# Patient Record
Sex: Female | Born: 1960 | Race: White | Hispanic: No | Marital: Married | Smoking: Never smoker
Health system: Southern US, Community
[De-identification: ages and names within clinical notes are randomized; demographics above are authoritative.]

## PROBLEM LIST (undated history)

## (undated) DIAGNOSIS — F419 Anxiety disorder, unspecified: Secondary | ICD-10-CM

## (undated) DIAGNOSIS — K759 Inflammatory liver disease, unspecified: Secondary | ICD-10-CM

## (undated) DIAGNOSIS — B019 Varicella without complication: Secondary | ICD-10-CM

## (undated) DIAGNOSIS — M19019 Primary osteoarthritis, unspecified shoulder: Secondary | ICD-10-CM

## (undated) DIAGNOSIS — F329 Major depressive disorder, single episode, unspecified: Secondary | ICD-10-CM

## (undated) DIAGNOSIS — D229 Melanocytic nevi, unspecified: Secondary | ICD-10-CM

## (undated) DIAGNOSIS — Z86010 Personal history of colonic polyps: Principal | ICD-10-CM

## (undated) DIAGNOSIS — R51 Headache: Secondary | ICD-10-CM

## (undated) DIAGNOSIS — F32A Depression, unspecified: Secondary | ICD-10-CM

## (undated) DIAGNOSIS — F509 Eating disorder, unspecified: Secondary | ICD-10-CM

## (undated) DIAGNOSIS — F1010 Alcohol abuse, uncomplicated: Secondary | ICD-10-CM

## (undated) DIAGNOSIS — K219 Gastro-esophageal reflux disease without esophagitis: Secondary | ICD-10-CM

## (undated) DIAGNOSIS — R17 Unspecified jaundice: Secondary | ICD-10-CM

## (undated) DIAGNOSIS — N200 Calculus of kidney: Secondary | ICD-10-CM

## (undated) DIAGNOSIS — IMO0001 Reserved for inherently not codable concepts without codable children: Secondary | ICD-10-CM

## (undated) DIAGNOSIS — Z8742 Personal history of other diseases of the female genital tract: Secondary | ICD-10-CM

## (undated) DIAGNOSIS — I10 Essential (primary) hypertension: Secondary | ICD-10-CM

## (undated) DIAGNOSIS — R32 Unspecified urinary incontinence: Secondary | ICD-10-CM

## (undated) DIAGNOSIS — M1990 Unspecified osteoarthritis, unspecified site: Secondary | ICD-10-CM

## (undated) HISTORY — DX: Personal history of colonic polyps: Z86.010

## (undated) HISTORY — DX: Alcohol abuse, uncomplicated: F10.10

## (undated) HISTORY — DX: Gastro-esophageal reflux disease without esophagitis: K21.9

## (undated) HISTORY — DX: Inflammatory liver disease, unspecified: K75.9

## (undated) HISTORY — DX: Major depressive disorder, single episode, unspecified: F32.9

## (undated) HISTORY — DX: Essential (primary) hypertension: I10

## (undated) HISTORY — DX: Calculus of kidney: N20.0

## (undated) HISTORY — DX: Headache: R51

## (undated) HISTORY — DX: Unspecified osteoarthritis, unspecified site: M19.90

## (undated) HISTORY — DX: Varicella without complication: B01.9

## (undated) HISTORY — DX: Reserved for inherently not codable concepts without codable children: IMO0001

## (undated) HISTORY — DX: Personal history of other diseases of the female genital tract: Z87.42

## (undated) HISTORY — PX: OTHER SURGICAL HISTORY: SHX169

## (undated) HISTORY — DX: Depression, unspecified: F32.A

## (undated) HISTORY — DX: Unspecified urinary incontinence: R32

## (undated) HISTORY — DX: Eating disorder, unspecified: F50.9

## (undated) HISTORY — DX: Unspecified jaundice: R17

## (undated) HISTORY — DX: Primary osteoarthritis, unspecified shoulder: M19.019

## (undated) HISTORY — DX: Melanocytic nevi, unspecified: D22.9

---

## 2007-10-19 ENCOUNTER — Emergency Department (HOSPITAL_COMMUNITY): Admission: EM | Admit: 2007-10-19 | Discharge: 2007-10-19 | Payer: Self-pay | Admitting: Emergency Medicine

## 2009-03-29 ENCOUNTER — Emergency Department (HOSPITAL_COMMUNITY): Admission: EM | Admit: 2009-03-29 | Discharge: 2009-03-29 | Payer: Self-pay | Admitting: Emergency Medicine

## 2010-03-31 ENCOUNTER — Other Ambulatory Visit: Admission: RE | Admit: 2010-03-31 | Discharge: 2010-03-31 | Payer: Self-pay | Admitting: Family Medicine

## 2010-04-29 ENCOUNTER — Encounter: Admission: RE | Admit: 2010-04-29 | Discharge: 2010-04-29 | Payer: Self-pay | Admitting: Family Medicine

## 2010-07-11 ENCOUNTER — Encounter
Admission: RE | Admit: 2010-07-11 | Discharge: 2010-07-11 | Payer: Self-pay | Source: Home / Self Care | Attending: Orthopedic Surgery | Admitting: Orthopedic Surgery

## 2011-03-28 LAB — CULTURE, ROUTINE-ABSCESS

## 2011-12-25 ENCOUNTER — Other Ambulatory Visit (HOSPITAL_COMMUNITY)
Admission: RE | Admit: 2011-12-25 | Discharge: 2011-12-25 | Disposition: A | Discharge status: Home / Self Care | Payer: BC Managed Care – PPO | Source: Ambulatory Visit | Attending: Family Medicine | Admitting: Family Medicine

## 2011-12-25 DIAGNOSIS — Z124 Encounter for screening for malignant neoplasm of cervix: Secondary | ICD-10-CM | POA: Insufficient documentation

## 2012-04-02 ENCOUNTER — Encounter (HOSPITAL_COMMUNITY): Payer: Self-pay | Admitting: *Deleted

## 2012-04-02 ENCOUNTER — Ambulatory Visit (HOSPITAL_COMMUNITY)
Admission: RE | Admit: 2012-04-02 | Discharge: 2012-04-02 | Disposition: A | Discharge status: Home / Self Care | Payer: BC Managed Care – PPO | Attending: Psychiatry | Admitting: Psychiatry

## 2012-04-02 DIAGNOSIS — F1020 Alcohol dependence, uncomplicated: Secondary | ICD-10-CM | POA: Insufficient documentation

## 2012-04-02 DIAGNOSIS — F4310 Post-traumatic stress disorder, unspecified: Secondary | ICD-10-CM | POA: Insufficient documentation

## 2012-04-02 HISTORY — DX: Anxiety disorder, unspecified: F41.9
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
## 2012-04-17 ENCOUNTER — Ambulatory Visit (INDEPENDENT_AMBULATORY_CARE_PROVIDER_SITE_OTHER): Payer: BC Managed Care – PPO | Admitting: Family Medicine

## 2012-04-17 ENCOUNTER — Ambulatory Visit (HOSPITAL_COMMUNITY): Admission: RE | Admit: 2012-04-17 | Payer: BC Managed Care – PPO | Source: Home / Self Care | Admitting: Psychiatry

## 2012-04-17 ENCOUNTER — Encounter: Payer: Self-pay | Admitting: Family Medicine

## 2012-04-17 VITALS — BP 124/100 | HR 82 | Temp 98.8°F | Wt 139.0 lb

## 2012-04-17 DIAGNOSIS — M25559 Pain in unspecified hip: Secondary | ICD-10-CM

## 2012-04-17 DIAGNOSIS — F1010 Alcohol abuse, uncomplicated: Secondary | ICD-10-CM

## 2012-04-17 LAB — CBC WITH DIFFERENTIAL/PLATELET
Basophils Absolute: 0 10*3/uL (ref 0.0–0.1)
Eosinophils Absolute: 0 10*3/uL (ref 0.0–0.7)
Hemoglobin: 12.8 g/dL (ref 12.0–15.0)
Lymphocytes Relative: 23.1 % (ref 12.0–46.0)
MCHC: 31.7 g/dL (ref 30.0–36.0)
Monocytes Absolute: 0.4 10*3/uL (ref 0.1–1.0)
Neutro Abs: 4.8 10*3/uL (ref 1.4–7.7)
RDW: 14.8 % — ABNORMAL HIGH (ref 11.5–14.6)

## 2012-04-17 LAB — COMPREHENSIVE METABOLIC PANEL
ALT: 26 U/L (ref 0–35)
AST: 29 U/L (ref 0–37)
Albumin: 4.1 g/dL (ref 3.5–5.2)
Calcium: 9.6 mg/dL (ref 8.4–10.5)
Chloride: 101 mEq/L (ref 96–112)
Potassium: 4 mEq/L (ref 3.5–5.1)

## 2012-04-17 LAB — URINALYSIS, ROUTINE W REFLEX MICROSCOPIC
Bilirubin Urine: NEGATIVE
Ketones, ur: NEGATIVE
Leukocytes, UA: NEGATIVE
Specific Gravity, Urine: 1.025 (ref 1.000–1.030)
Urobilinogen, UA: 0.2 (ref 0.0–1.0)

## 2012-04-17 NOTE — Progress Notes (Signed)
Chief Complaint  Patient presents with  . Establish Care    HPI:  Brittany Shepherd is here to establish care.  Per review of chart in our system, she recently enrolled in the [facility] intensive outpatient addiction chemical dependency program under the care of Dr. Allena Katz.  It also appears per review of labs that she has a history of MRSA skin abscess. In AA and in in intensive alchohol rehab program. Needs labs today for this: CMP, CBC with diff, urinalysis, UDS, Blood and alcohol level. Drinking 1/2 of fifth vodka  per day and now in intensive program monitored by MD.  Ephriam Knuckles and going to church, and feels like god is working in her life. Exercising daily, silver sneaker and gym, does yoga Wants to see Dr. Constance Goltz at [facility] orthopedics for R hip pain that started after limping around for 6 months s/p a broken toe. She also had a fall about 10 months ago and did have plain films and evaluation and thought to be a contusion, but hip pain since. Evaluation with revious PCP thought it was bursitis. Occ flares of R Lat hip pain. Worse with pigeon move in yoga and lat hip exercises with band,  Other Providers: -sees [facility], [facility] - for anxiety - took xanax in the past -had physical with PCP in June 2013, pap was normal -pt will schedule mammo -pt will schedule colonoscopy at age 38  Flu vaccine: -refused flu today -had tdap with physical  ROS: See pertinent positives and negatives per HPI.  Past Medical History  Diagnosis Date  . Anxiety   . Mental disorder   . Alcohol abuse   . Chicken pox   . Depression   . Eating disorder   . Headache     frequent  . GERD (gastroesophageal reflux disease)   . Hepatitis   . Jaundice   . Hypertension     high blood pressure readings  . Urine incontinence   . Kidney stones     Family History  Problem Relation Age of Onset  . Arthritis Other     paternal great grandfather  .  Arthritis Other     parent  . Alcohol abuse Other     paternal and maternal uncle  . Prostate cancer Paternal Grandfather   . Hyperlipidemia Mother   . Heart disease Maternal Grandmother   . Hypertension    . Mental illness    . Diabetes    . ADD / ADHD Brother   . ADD / ADHD Son     History   Social History  . Marital Status: Single    Spouse Name: N/A    Number of Children: N/A  . Years of Education: N/A   Social History Main Topics  . Smoking status: Never Smoker   . Smokeless tobacco: Never Used  . Alcohol Use: Yes     per patient vodka   . Drug Use: No  . Sexually Active: Yes   Other Topics Concern  . None   Social History Narrative  . None    Current outpatient prescriptions:acetaminophen (TYLENOL) 500 MG tablet, Take 500 mg by mouth every 6 (six) hours as needed., Disp: , Rfl: ;  FLUoxetine (PROZAC) 40 MG capsule, Take 40 mg by mouth daily., Disp: , Rfl: ;  omeprazole (PRILOSEC) 20 MG capsule, Take 20 mg by mouth daily., Disp: , Rfl: ;  saccharomyces boulardii (FLORASTOR) 250 MG capsule, Take 250 mg by mouth 2 (two)  times daily., Disp: , Rfl:  vitamin B-12 (CYANOCOBALAMIN) 1000 MCG tablet, Take 2,500 mcg by mouth daily., Disp: , Rfl: ;  Diclofenac Sodium (PENNSAID) 1.5 % SOLN, Place 20 drops onto the skin. To affected area four times a day., Disp: , Rfl:   EXAM:  Filed Vitals:   04/17/12 1140  BP: 124/100  Pulse: 82  Temp: 98.8 F (37.1 C)    There is no height on file to calculate BMI.  GENERAL: vitals reviewed and listed above, alert, oriented, appears well hydrated and in no acute distress  HEENT: atraumatic, conjunttiva clear, no obvious abnormalities on inspection of external nose and ears  NECK: no obvious masses on inspection  LUNGS: clear to auscultation bilaterally, no wheezes, rales or rhonchi, good air movement  CV: HRRR, no peripheral edema  MS: moves all extremities without noticeable abnormality TTP lateral hip over greater  trochanteric bursa region. Neg SLRT, CLRT, FABER and FADIR.  PSYCH: pleasant and cooperative, no obvious depression or anxiety  ASSESSMENT AND PLAN:  Discussed the following assessment and plan:  1. Alcohol abuse  CMP, CBC with Differential, Urinalysis with Reflex Microscopic, Drug Screen, Urine, Ethanol  2. Hip pain  Suspect greater trochanteric bursitis, will start with conservative tx per recs below. F/u in one month.   -We reviewed her PMH, PSH, FH, SH, Meds and Allergies. -We addressed her current concerns per orders and patient instructions. -We have asked for records for pertinent exams, studies, vaccines and notes from previous providers  -Patient advised to return or notify a doctor immediately if symptoms worsen or persist or new concerns arise.  Patient Instructions   -we are so proud of you with your work to get off of alcohol. Keep up the great work and let us know how we can help!  -We have ordered labs or studies at this visit. It usually takes 1-2 weeks for results and processing. We will contact you with instructions IF your results are abnormal. Normal results will be released to your [facility] in 1-2 weeks. If you have not heard from [place] or can not find your results in [facility] in 2 weeks please contact our office.  -For hip pain: -heat for 15 minutes twice daily -modified gentler pose in yoga and limiting activities that aggrevate pain for 2 weeks -passive cross legged stretch - hold 15 secs and repeat 5-10 times daily -follow up with me in 1 month  Thank you for enrolling in MyChart. Please follow the instructions below to securely access your online medical record. MyChart allows you to send messages to your doctor, view your test results, renew your prescriptions, schedule appointments, and more.  How Do I Sign Up? 1. In your Internet browser, go to [email or web address] 2. Click on the New  User? link in the Sign In box.  3. Enter your MyChart  Access Code exactly as it appears below. You will not need to use this code after you have completed the sign-up process. If you do not sign up before the expiration date, you must request a new code. MyChart Access Code: [email or web address] Expires: 05/17/2012 12:15 PM  4. Enter the last four digits of your Social Security Number (xxxx) and Date of Birth (mm/dd/yyyy) as indicated and click Next. You will be taken to the next sign-up page. 5. Create a MyChart ID. This will be your MyChart login ID and cannot be changed, so think of one that is secure and easy to remember. 6. Create a MyChart password.  You can change your password at any time. 7. Enter your Password Reset Question and Answer and click Next. This can be used at a later time if you forget your password.  8. Select your communication preference, and if applicable enter your e-mail address. You will receive e-mail notification when new information is available in MyChart by choosing to receive e-mail notifications and filling in your e-mail. 9. Click Sign In. You can now view your medical record.   Additional Information If you have questions, you can email [email or web address]  [email or web address] or call [phone] to talk to our MyChart staff. Remember, MyChart is NOT to be used for urgent needs. For medical emergencies, dial 911.          Kriste Basque R.

## 2012-04-17 NOTE — Patient Instructions (Addendum)
-  we are so proud of you with your work to get off of alcohol. Keep up the great work and let us know how we can help!  -We have ordered labs or studies at this visit. It usually takes 1-2 weeks for results and processing. We will contact you with instructions IF your results are abnormal. Normal results will be released to your [facility] in 1-2 weeks. If you have not heard from [place] or can not find your results in [facility] in 2 weeks please contact our office.  -For hip pain: -heat for 15 minutes twice daily -modified gentler pose in yoga and limiting activities that aggrevate pain for 2 weeks -passive cross legged stretch - hold 15 secs and repeat 5-10 times daily -follow up with me in 1 month  Thank you for enrolling in MyChart. Please follow the instructions below to securely access your online medical record. MyChart allows you to send messages to your doctor, view your test results, renew your prescriptions, schedule appointments, and more.  How Do I Sign Up? 1. In your Internet browser, go to [email or web address] 2. Click on the New  User? link in the Sign In box.  3. Enter your MyChart Access Code exactly as it appears below. You will not need to use this code after you have completed the sign-up process. If you do not sign up before the expiration date, you must request a new code. MyChart Access Code: [email or web address] Expires: 05/17/2012 12:15 PM  4. Enter the last four digits of your Social Security Number (xxxx) and Date of Birth (mm/dd/yyyy) as indicated and click Next. You will be taken to the next sign-up page. 5. Create a MyChart ID. This will be your MyChart login ID and cannot be changed, so think of one that is secure and easy to remember. 6. Create a MyChart password. You can change your password at any time. 7. Enter your Password Reset Question and Answer and click Next. This can be used at a later time if you forget your password.  8. Select your  communication preference, and if applicable enter your e-mail address. You will receive e-mail notification when new information is available in MyChart by choosing to receive e-mail notifications and filling in your e-mail. 9. Click Sign In. You can now view your medical record.   Additional Information If you have questions, you can email [email or web address]  [email or web address] or call [phone] to talk to our MyChart staff. Remember, MyChart is NOT to be used for urgent needs. For medical emergencies, dial 911.

## 2012-04-17 NOTE — BH Assessment (Signed)
Pt and husband arrived at [facility]. Pt requesting detox from alcohol. Pt had an assessment 04/15/2012. Pt had lab work from her PCP drawn today. Pt was requesting to be admitted to [facility] without going through the emergency department and without a second assessment. Pt is not suicidal, homicidal nor psychotic. Writer explained that: no chemical dependence beds were available today, an assessment would need to be done the day of admission, and it would be at the Dr's discretion what would be acceptable as medical clearance. Pt and husband declined assessment today. Patient has referrals she was given on Monday and will seek treatment elsewhere.

## 2012-04-18 LAB — DRUG SCREEN, URINE
Barbiturate Quant, Ur: NEGATIVE
Cocaine Metabolites: NEGATIVE
Creatinine,U: 110.08 mg/dL
Methadone: NEGATIVE
Propoxyphene: NEGATIVE

## 2012-04-18 LAB — ETHANOL: Alcohol, Ethyl (B): <10 mg/dL (ref 0–10)

## 2012-04-22 ENCOUNTER — Encounter: Payer: Self-pay | Admitting: Family Medicine

## 2012-04-22 DIAGNOSIS — R030 Elevated blood-pressure reading, without diagnosis of hypertension: Secondary | ICD-10-CM

## 2012-04-22 DIAGNOSIS — Z8742 Personal history of other diseases of the female genital tract: Secondary | ICD-10-CM

## 2012-04-22 DIAGNOSIS — F419 Anxiety disorder, unspecified: Secondary | ICD-10-CM

## 2012-04-22 DIAGNOSIS — M19019 Primary osteoarthritis, unspecified shoulder: Secondary | ICD-10-CM

## 2012-04-22 DIAGNOSIS — IMO0001 Reserved for inherently not codable concepts without codable children: Secondary | ICD-10-CM

## 2012-04-22 HISTORY — DX: Anxiety disorder, unspecified: F41.9

## 2012-04-22 HISTORY — DX: Reserved for inherently not codable concepts without codable children: IMO0001

## 2012-04-22 HISTORY — DX: Personal history of other diseases of the female genital tract: Z87.42

## 2012-04-22 HISTORY — DX: Primary osteoarthritis, unspecified shoulder: M19.019

## 2012-04-22 NOTE — Progress Notes (Signed)
Some records received and briefly reviewed. PMH updated. In summary:  CPE 12/2011 with normal pap, hx abnormal pap Health maintenance and Problem list updated.

## 2012-04-23 ENCOUNTER — Telehealth: Payer: Self-pay | Admitting: Family Medicine

## 2012-04-23 NOTE — Telephone Encounter (Signed)
Pls advise.  

## 2012-04-23 NOTE — Telephone Encounter (Signed)
I have know idea what this means, can you clarify?

## 2012-04-23 NOTE — Telephone Encounter (Signed)
I am waiting to send records. Please sign off on patient labs.

## 2012-04-23 NOTE — Telephone Encounter (Signed)
Per Dr. Selena Batten labs can be faxed.

## 2012-05-16 ENCOUNTER — Encounter: Payer: Self-pay | Admitting: Family Medicine

## 2012-05-16 ENCOUNTER — Ambulatory Visit (INDEPENDENT_AMBULATORY_CARE_PROVIDER_SITE_OTHER): Payer: BC Managed Care – PPO | Admitting: Family Medicine

## 2012-05-16 VITALS — BP 118/84 | HR 64 | Temp 98.1°F | Wt 139.0 lb

## 2012-05-16 DIAGNOSIS — M7060 Trochanteric bursitis, unspecified hip: Secondary | ICD-10-CM

## 2012-05-16 DIAGNOSIS — M76899 Other specified enthesopathies of unspecified lower limb, excluding foot: Secondary | ICD-10-CM

## 2012-05-16 NOTE — Patient Instructions (Signed)
Trochanteric Bursitis You have hip pain due to trochanteric bursitis. Bursitis means that the sack near the outside of the hip is filled with fluid and inflamed. This sack is made up of protective soft tissue. The pain from trochanteric bursitis can be severe and keep you from sleep. It can radiate to the buttocks or down the outside of the thigh to the knee. The pain is almost always worse when rising from the seated or lying position and with walking. Pain can improve after you take a few steps. It happens more often in people with hip joint and lumbar spine problems, such as arthritis or previous surgery. Very rarely the trochanteric bursa can become infected, and antibiotics and/or surgery may be needed. Treatment often includes an injection of local anesthetic mixed with cortisone medicine. This medicine is injected into the area where it is most tender over the hip. Repeat injections may be necessary if the response to treatment is slow. You can apply ice packs over the tender area for 30 minutes every 2 hours for the next few days. Anti-inflammatory and/or narcotic pain medicine may also be helpful. Limit your activity for the next few days if the pain continues. See your caregiver in 5-10 days if you are not greatly improved.  SEEK IMMEDIATE MEDICAL CARE IF:  You develop severe pain, fever, or increased redness.  You have pain that radiates below the knee. EXERCISES STRETCHING EXERCISES - Trochantic Bursitis  These exercises may help you when beginning to rehabilitate your injury. Your symptoms may resolve with or without further involvement from your physician, physical therapist or athletic trainer. While completing these exercises, remember:   Restoring tissue flexibility helps normal motion to return to the joints. This allows healthier, less painful movement and activity.  An effective stretch should be held for at least 30 seconds.  A stretch should never be painful. You should only  feel a gentle lengthening or release in the stretched tissue. STRETCH  Iliotibial Band  On the floor or bed, lie on your side so your injured leg is on top. Bend your knee and grab your ankle.  Slowly bring your knee back so that your thigh is in line with your trunk. Keep your heel at your buttocks and gently arch your back so your head, shoulders and hips line up.  Slowly lower your leg so that your knee approaches the floor/bed until you feel a gentle stretch on the outside of your thigh. If you do not feel a stretch and your knee will not fall farther, place the heel of your opposite foot on top of your knee and pull your thigh down farther.  Hold this stretch for _______15-30___ seconds.  Repeat ________3__ times. Complete this exercise _______1___ times per day. STRETCH Hamstrings, Supine   Lie on your back. Loop a belt or towel over the ball of your foot as shown.  Straighten your knee and slowly pull on the belt to raise your injured leg. Do not allow the knee to bend. Keep your opposite leg flat on the floor.  Raise the leg until you feel a gentle stretch behind your knee or thigh. Hold this position for _________15-30_ seconds.  Repeat ____3______ times. Complete this stretch ______1____ times per day. STRETCH - Quadriceps, Prone   Lie on your stomach on a firm surface, such as a bed or padded floor.  Bend your knee and grasp your ankle. If you are unable to reach, your ankle or pant leg, use a belt around  your foot to lengthen your reach.  Gently pull your heel toward your buttocks. Your knee should not slide out to the side. You should feel a stretch in the front of your thigh and/or knee.  Hold this position for ______14____ seconds.  Repeat _____3_____ times. Complete this stretch ______1____ times per day. STRETCHING - Hip Flexors, Lunge Half kneel with your knee on the floor and your opposite knee bent and directly over your ankle.  Keep good posture with your head  over your shoulders. Tighten your buttocks to point your tailbone downward; this will prevent your back from arching too much.  You should feel a gentle stretch in the front of your thigh and/or hip. If you do not feel any resistance, slightly slide your opposite foot forward and then slowly lunge forward so your knee once again lines up over your ankle. Be sure your tailbone remains pointed downward.  Hold this stretch for ______15____ seconds.  Repeat _____3_____ times. Complete this stretch ___1_______ times per day. STRETCH - Adductors, Lunge  While standing, spread your legs  Lean away from your injured leg by bending your opposite knee. You may rest your hands on your thigh for balance.  You should feel a stretch in your inner thigh. Hold for ______15____ seconds.  Repeat ________3__ times. Complete this exercise _________1_ times per day. Document Released: 07/27/2004 Document Revised: 09/11/2011 Document Reviewed: 10/01/2008 [facility] Patient Information 2013 [place], [place].

## 2012-05-16 NOTE — Progress Notes (Addendum)
Chief Complaint  Patient presents with  . Follow-up    1 mth    HPI:  R lateral hip pain: -on and off for a long time, recent flare and seen about 1 month ago -thought to be trochanteric bursitis last visit and advised conservative tx - has been doing heat, stretching and trying to avoid aggravating factors, pain level severe -pain is described as: not improving, R lateral soreness, sharp - sometimes pain in R lower back and SI joint region as well -worse with pigeon move in yoga and lateral hip exercises with band and lying on that side -better with resting -denies: radiation, weakness, numbness, fevers, weightloss, bowel or bladder dysfunction -hx of fall and had plain films -due for physical 12/2012 -wants to see ortho  Alcohol Abuse: -28 days sober and doing great! -plugged into rehab  ROS: See pertinent positives and negatives per HPI.  Past Medical History  Diagnosis Date  . Anxiety   . Mental disorder   . Alcohol abuse   . Chicken pox   . Depression   . Eating disorder   . Headache     frequent  . GERD (gastroesophageal reflux disease)   . Hepatitis   . Jaundice   . Hypertension     high blood pressure readings  . Urine incontinence   . Kidney stones     Family History  Problem Relation Age of Onset  . Arthritis Other     paternal great grandfather  . Arthritis Other     parent  . Alcohol abuse Other     paternal and maternal uncle  . Prostate cancer Paternal Grandfather   . Hyperlipidemia Mother   . Heart disease Maternal Grandmother   . Hypertension    . Mental illness    . Diabetes    . ADD / ADHD Brother   . ADD / ADHD Son     History   Social History  . Marital Status: Single    Spouse Name: N/A    Number of Children: N/A  . Years of Education: N/A   Social History Main Topics  . Smoking status: Never Smoker   . Smokeless tobacco: Never Used  . Alcohol Use: Yes     Comment: per patient vodka   . Drug Use: No  . Sexually Active:  Yes   Other Topics Concern  . None   Social History Narrative  . None    Current outpatient prescriptions:acetaminophen (TYLENOL) 500 MG tablet, Take 500 mg by mouth every 6 (six) hours as needed., Disp: , Rfl: ;  Diclofenac Sodium (PENNSAID) 1.5 % SOLN, Place 20 drops onto the skin. To affected area four times a day., Disp: , Rfl: ;  FLUoxetine (PROZAC) 40 MG capsule, Take 40 mg by mouth daily., Disp: , Rfl: ;  omeprazole (PRILOSEC) 20 MG capsule, Take 20 mg by mouth daily., Disp: , Rfl:  saccharomyces boulardii (FLORASTOR) 250 MG capsule, Take 250 mg by mouth 2 (two) times daily., Disp: , Rfl: ;  vitamin B-12 (CYANOCOBALAMIN) 1000 MCG tablet, Take 2,500 mcg by mouth daily., Disp: , Rfl:   EXAM:  Filed Vitals:   05/16/12 1101  BP: 118/84  Pulse: 64  Temp: 98.1 F (36.7 C)    There is no height on file to calculate BMI.  GENERAL: vitals reviewed and listed above, alert, oriented, appears well hydrated and in no acute distress  MS: moves all extremities without noticeable abnormality - TTP greater trochanter R, neg TTP elsewhere,  neg FABER, FADIR, compression, piriformis, SLRT   PSYCH: pleasant and cooperative, no obvious depression or anxiety  ASSESSMENT AND PLAN:  Discussed the following assessment and plan:  1. Trochanteric bursitis    -discussed likely dx trochanteric bursitis, failed conservative tx - pt active and activities tend to cause flares of pain, neg plain films in the past per pt. Discussed tx option and referral and risks/benefits. Pt prefers to try steroid inj and understands risks including but not limited to infection, bleeding, pain, fracture and more. Discussed risks and informed consent obtained. Area cleaned with betadine, 40mg  depo-medrol with 5cc 1% lidocaine inj.  May also be component of sacroiliitis. HEP provided. Follow up in 1 month. If not improving will refer. Pt failed to tell me until after procedure that she has hx of boils on skin. Advised again  of risk of infection and return precautions. -Patient advised to return or notify a doctor immediately if symptoms worsen or persist or new concerns arise.  Patient Instructions  Trochanteric Bursitis You have hip pain due to trochanteric bursitis. Bursitis means that the sack near the outside of the hip is filled with fluid and inflamed. This sack is made up of protective soft tissue. The pain from trochanteric bursitis can be severe and keep you from sleep. It can radiate to the buttocks or down the outside of the thigh to the knee. The pain is almost always worse when rising from the seated or lying position and with walking. Pain can improve after you take a few steps. It happens more often in people with hip joint and lumbar spine problems, such as arthritis or previous surgery. Very rarely the trochanteric bursa can become infected, and antibiotics and/or surgery may be needed. Treatment often includes an injection of local anesthetic mixed with cortisone medicine. This medicine is injected into the area where it is most tender over the hip. Repeat injections may be necessary if the response to treatment is slow. You can apply ice packs over the tender area for 30 minutes every 2 hours for the next few days. Anti-inflammatory and/or narcotic pain medicine may also be helpful. Limit your activity for the next few days if the pain continues. See your caregiver in 5-10 days if you are not greatly improved.  SEEK IMMEDIATE MEDICAL CARE IF:  You develop severe pain, fever, or increased redness.  You have pain that radiates below the knee. EXERCISES STRETCHING EXERCISES - Trochantic Bursitis  These exercises may help you when beginning to rehabilitate your injury. Your symptoms may resolve with or without further involvement from your physician, physical therapist or athletic trainer. While completing these exercises, remember:   Restoring tissue flexibility helps normal motion to return to the  joints. This allows healthier, less painful movement and activity.  An effective stretch should be held for at least 30 seconds.  A stretch should never be painful. You should only feel a gentle lengthening or release in the stretched tissue. STRETCH  Iliotibial Band  On the floor or bed, lie on your side so your injured leg is on top. Bend your knee and grab your ankle.  Slowly bring your knee back so that your thigh is in line with your trunk. Keep your heel at your buttocks and gently arch your back so your head, shoulders and hips line up.  Slowly lower your leg so that your knee approaches the floor/bed until you feel a gentle stretch on the outside of your thigh. If you do not feel  a stretch and your knee will not fall farther, place the heel of your opposite foot on top of your knee and pull your thigh down farther.  Hold this stretch for _______15-30___ seconds.  Repeat ________3__ times. Complete this exercise _______1___ times per day. STRETCH Hamstrings, Supine   Lie on your back. Loop a belt or towel over the ball of your foot as shown.  Straighten your knee and slowly pull on the belt to raise your injured leg. Do not allow the knee to bend. Keep your opposite leg flat on the floor.  Raise the leg until you feel a gentle stretch behind your knee or thigh. Hold this position for _________15-30_ seconds.  Repeat ____3______ times. Complete this stretch ______1____ times per day. STRETCH - Quadriceps, Prone   Lie on your stomach on a firm surface, such as a bed or padded floor.  Bend your knee and grasp your ankle. If you are unable to reach, your ankle or pant leg, use a belt around your foot to lengthen your reach.  Gently pull your heel toward your buttocks. Your knee should not slide out to the side. You should feel a stretch in the front of your thigh and/or knee.  Hold this position for ______14____ seconds.  Repeat _____3_____ times. Complete this stretch  ______1____ times per day. STRETCHING - Hip Flexors, Lunge Half kneel with your knee on the floor and your opposite knee bent and directly over your ankle.  Keep good posture with your head over your shoulders. Tighten your buttocks to point your tailbone downward; this will prevent your back from arching too much.  You should feel a gentle stretch in the front of your thigh and/or hip. If you do not feel any resistance, slightly slide your opposite foot forward and then slowly lunge forward so your knee once again lines up over your ankle. Be sure your tailbone remains pointed downward.  Hold this stretch for ______15____ seconds.  Repeat _____3_____ times. Complete this stretch ___1_______ times per day. STRETCH - Adductors, Lunge  While standing, spread your legs  Lean away from your injured leg by bending your opposite knee. You may rest your hands on your thigh for balance.  You should feel a stretch in your inner thigh. Hold for ______15____ seconds.  Repeat ________3__ times. Complete this exercise _________1_ times per day. Document Released: 07/27/2004 Document Revised: 09/11/2011 Document Reviewed: 10/01/2008 [facility] Patient Information 2013 [place], [place], [place] R.

## 2012-06-04 ENCOUNTER — Encounter: Payer: Self-pay | Admitting: Family Medicine

## 2012-06-04 ENCOUNTER — Ambulatory Visit (INDEPENDENT_AMBULATORY_CARE_PROVIDER_SITE_OTHER): Payer: BC Managed Care – PPO | Admitting: Family Medicine

## 2012-06-04 ENCOUNTER — Telehealth: Payer: Self-pay | Admitting: Family Medicine

## 2012-06-04 ENCOUNTER — Ambulatory Visit: Payer: BC Managed Care – PPO | Admitting: Family Medicine

## 2012-06-04 ENCOUNTER — Ambulatory Visit (INDEPENDENT_AMBULATORY_CARE_PROVIDER_SITE_OTHER)
Admission: RE | Admit: 2012-06-04 | Discharge: 2012-06-04 | Disposition: A | Discharge status: Home / Self Care | Payer: BC Managed Care – PPO | Source: Ambulatory Visit | Attending: Family Medicine | Admitting: Family Medicine

## 2012-06-04 VITALS — BP 120/80 | HR 106 | Temp 98.7°F | Wt 138.0 lb

## 2012-06-04 DIAGNOSIS — M25559 Pain in unspecified hip: Secondary | ICD-10-CM

## 2012-06-04 DIAGNOSIS — M25529 Pain in unspecified elbow: Secondary | ICD-10-CM

## 2012-06-04 DIAGNOSIS — M533 Sacrococcygeal disorders, not elsewhere classified: Secondary | ICD-10-CM

## 2012-06-04 NOTE — Patient Instructions (Addendum)
Please get xray - we will call you with results/further instructions  Please do not do any strenuous activity with this arm  Please ice daily

## 2012-06-04 NOTE — Progress Notes (Signed)
Chief Complaint  Patient presents with  . Arm Injury    HPI:  Acute visit for arm pain: -fell over loose brick and hit R arm on a metal railing 2 weeks ago -has had pain, bruising and swelling in R lateral elbow -friend who is a doctor thought it was hematoma -able to bend elbow fine, but has some pain in this area and some tingling in ulnar dorsal part of hand sometimes, pain with activities, pain around lateral epicondyle R arm - no numbness, weakness, fevers or chills  ROS: See pertinent positives and negatives per HPI.  Past Medical History  Diagnosis Date  . Anxiety   . Mental disorder   . Alcohol abuse   . Chicken pox   . Depression   . Eating disorder   . Headache     frequent  . GERD (gastroesophageal reflux disease)   . Hepatitis   . Jaundice   . Hypertension     high blood pressure readings  . Urine incontinence   . Kidney stones     Family History  Problem Relation Age of Onset  . Arthritis Other     paternal great grandfather  . Arthritis Other     parent  . Alcohol abuse Other     paternal and maternal uncle  . Prostate cancer Paternal Grandfather   . Hyperlipidemia Mother   . Heart disease Maternal Grandmother   . Hypertension    . Mental illness    . Diabetes    . ADD / ADHD Brother   . ADD / ADHD Son     History   Social History  . Marital Status: Single    Spouse Name: N/A    Number of Children: N/A  . Years of Education: N/A   Social History Main Topics  . Smoking status: Never Smoker   . Smokeless tobacco: Never Used  . Alcohol Use: Yes     Comment: per patient vodka   . Drug Use: No  . Sexually Active: Yes   Other Topics Concern  . None   Social History Narrative  . None    Current outpatient prescriptions:acetaminophen (TYLENOL) 500 MG tablet, Take 500 mg by mouth every 6 (six) hours as needed., Disp: , Rfl: ;  omeprazole (PRILOSEC) 20 MG capsule, Take 20 mg by mouth daily., Disp: , Rfl: ;  saccharomyces boulardii  (FLORASTOR) 250 MG capsule, Take 250 mg by mouth 2 (two) times daily., Disp: , Rfl: ;  vitamin B-12 (CYANOCOBALAMIN) 1000 MCG tablet, Take 2,500 mcg by mouth daily., Disp: , Rfl:  Diclofenac Sodium (PENNSAID) 1.5 % SOLN, Place 20 drops onto the skin. To affected area four times a day., Disp: , Rfl: ;  FLUoxetine (PROZAC) 40 MG capsule, Take 40 mg by mouth daily., Disp: , Rfl:   EXAM:  Filed Vitals:   06/04/12 1447  BP: 120/80  Pulse: 106  Temp: 98.7 F (37.1 C)    There is no height on file to calculate BMI.  GENERAL: vitals reviewed and listed above, alert, oriented, appears well hydrated and in no acute distress  HEENT: atraumatic, conjunttiva clear, no obvious abnormalities on inspection of external nose and ears  NECK: no obvious masses on inspection  MS/NEURO: moves all extremities without noticeable abnormality Normal ROM both arms, swelling - hematoma and tenderness around lateral epicondlye of R humerus, some medial ecchymosis of R forearm as well. Normal muscle strength and sensation throughout bilat UEs.  PSYCH: pleasant and cooperative, no obvious  depression or anxiety  ASSESSMENT AND PLAN:  Discussed the following assessment and plan:  1. Elbow pain  DG Elbow Complete Right   -hematoma, plain films to r/o fx -Patient advised to return or notify a doctor immediately if symptoms worsen or persist or new concerns arise.  Patient Instructions  Please get xray - we will call you with results/further instructions  Please do not do any strenuous activity with this arm  Please ice daily      KIM, HANNAH R.

## 2012-06-04 NOTE — Telephone Encounter (Signed)
Please let her know xray looked good. Advise ice to area and follow up if not improving over next 2-4 weeks.

## 2012-06-05 NOTE — Telephone Encounter (Signed)
Called and spoke with pt and pt is aware.  Pt states she received an injection in her hip and it seems to be helping.  Pt states Dr. Selena Batten told her she could refer her out for her other hip.  Pt would like referral.

## 2012-06-06 NOTE — Addendum Note (Signed)
Addended by: Terressa Koyanagi on: 06/06/2012 08:39 AM   Modules accepted: Orders

## 2012-06-19 ENCOUNTER — Ambulatory Visit: Payer: Self-pay | Admitting: Family Medicine

## 2012-07-25 ENCOUNTER — Encounter: Payer: Self-pay | Admitting: Family Medicine

## 2012-07-25 NOTE — Progress Notes (Signed)
Received notes from Dr. Ethelene Hal at [facility] - R hip pain, likely SI joint pathology, recs for PT and follow up. OV notes scanned into chart.

## 2014-08-04 ENCOUNTER — Telehealth: Payer: Self-pay | Admitting: Family Medicine

## 2014-08-04 NOTE — Telephone Encounter (Signed)
Pt just lost her job and will have ins for at least next 30 days. Pt would like cpx within  30 day. Can I create 30 min slot?

## 2014-08-04 NOTE — Telephone Encounter (Signed)
Ok

## 2014-08-05 NOTE — Telephone Encounter (Signed)
Pt has been sch

## 2014-08-19 ENCOUNTER — Encounter: Payer: Self-pay | Admitting: Family Medicine

## 2014-08-19 ENCOUNTER — Ambulatory Visit (INDEPENDENT_AMBULATORY_CARE_PROVIDER_SITE_OTHER): Payer: BLUE CROSS/BLUE SHIELD | Admitting: Family Medicine

## 2014-08-19 ENCOUNTER — Other Ambulatory Visit (HOSPITAL_COMMUNITY)
Admission: RE | Admit: 2014-08-19 | Discharge: 2014-08-19 | Disposition: A | Discharge status: Home / Self Care | Payer: BLUE CROSS/BLUE SHIELD | Source: Ambulatory Visit | Attending: Family Medicine | Admitting: Family Medicine

## 2014-08-19 VITALS — BP 102/70 | HR 68 | Temp 98.0°F | Ht 62.75 in | Wt 131.0 lb

## 2014-08-19 DIAGNOSIS — Z23 Encounter for immunization: Secondary | ICD-10-CM

## 2014-08-19 DIAGNOSIS — Z124 Encounter for screening for malignant neoplasm of cervix: Secondary | ICD-10-CM

## 2014-08-19 DIAGNOSIS — Z0000 Encounter for general adult medical examination without abnormal findings: Secondary | ICD-10-CM

## 2014-08-19 DIAGNOSIS — Z8742 Personal history of other diseases of the female genital tract: Secondary | ICD-10-CM

## 2014-08-19 DIAGNOSIS — Z01419 Encounter for gynecological examination (general) (routine) without abnormal findings: Secondary | ICD-10-CM | POA: Diagnosis not present

## 2014-08-19 DIAGNOSIS — Z1151 Encounter for screening for human papillomavirus (HPV): Secondary | ICD-10-CM | POA: Insufficient documentation

## 2014-08-19 DIAGNOSIS — Z1211 Encounter for screening for malignant neoplasm of colon: Secondary | ICD-10-CM

## 2014-08-19 DIAGNOSIS — Z1239 Encounter for other screening for malignant neoplasm of breast: Secondary | ICD-10-CM

## 2014-08-19 LAB — LIPID PANEL
CHOLESTEROL: 242 mg/dL — AB (ref 0–200)
HDL: 54.3 mg/dL (ref >39.00)
LDL Cholesterol: 158 mg/dL — ABNORMAL HIGH (ref 0–99)
NonHDL: 187.7
Total CHOL/HDL Ratio: 4
Triglycerides: 147 mg/dL (ref 0.0–149.0)
VLDL: 29.4 mg/dL (ref 0.0–40.0)

## 2014-08-19 LAB — HEMOGLOBIN A1C: Hgb A1c MFr Bld: 5.9 % (ref 4.6–6.5)

## 2014-08-19 NOTE — Progress Notes (Signed)
HPI:  Here for CPE:  -Concerns and/or follow up today:  Wanted CPE before runs out of insurance - unfortunately lost her job recently. 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  -Diet: variety of foods, balance and well rounded, larger portion sizes  -Exercise: no regular exercise  -Taking folic acid, vitamin D or calcium: no  -Diabetes and Dyslipidemia Screening:  FASTING labs today  -Hx of HTN: no  -Vaccines: flu vaccine? Willing to do today  -pap history: 12/2011, normal  -FDLMP:  Post menopausal  -sexual activity: yes, female partner, no new partners  -wants STI testing: no  -FH breast, colon or ovarian ca: see FH Last mammogram: 2011 Last colon cancer screening: stool cards after discussion per her preference  Breast Ca Risk Assessment: No personal of FH breast or ovarian ca or biopsy   -Alcohol, Tobacco, drug use: see social history  Review of Systems - no fevers, unintentional weight loss, vision loss, hearing loss, chest pain, sob, hemoptysis, melena, hematochezia, hematuria, genital discharge, changing or concerning skin lesions, bleeding, bruising, loc, thoughts of self harm or SI  Past Medical History  Diagnosis Date  . Anxiety   . Mental disorder   . Alcohol abuse   . Chicken pox   . Depression   . Eating disorder   . Headache(784.0)     frequent  . GERD (gastroesophageal reflux disease)   . Hepatitis   . Jaundice   . Hypertension     high blood pressure readings  . Urine incontinence   . Kidney stones   . Nevus     L face, eval by derm in the past, told cosmetic    Past Surgical History  Procedure Laterality Date  . Urethrea stretched  age 68    Family History  Problem Relation Age of Onset  . Arthritis Other     paternal great grandfather  . Arthritis Other     parent  . Alcohol abuse Other     paternal and maternal uncle  . Prostate cancer Paternal Grandfather   . Hyperlipidemia Mother   . Heart disease  Maternal Grandmother   . Hypertension    . Mental illness    . Diabetes    . ADD / ADHD Brother   . ADD / ADHD Son     History   Social History  . Marital Status: Single    Spouse Name: N/A  . Number of Children: N/A  . Years of Education: N/A   Social History Main Topics  . Smoking status: Never Smoker   . Smokeless tobacco: Never Used  . Alcohol Use: Yes     Comment: per patient vodka   . Drug Use: No  . Sexual Activity: Yes   Other Topics Concern  . None   Social History Narrative     Current outpatient prescriptions:  .  acetaminophen (TYLENOL) 500 MG tablet, Take 500 mg by mouth every 6 (six) hours as needed., Disp: , Rfl:  .  vitamin B-12 (CYANOCOBALAMIN) 1000 MCG tablet, Take 2,500 mcg by mouth daily., Disp: , Rfl:   EXAM:  Filed Vitals:   08/19/14 0908  BP: 102/70  Pulse: 68  Temp: 98 F (36.7 C)    GENERAL: vitals reviewed and listed below, alert, oriented, appears well hydrated and in no acute distress  HEENT: head atraumatic, PERRLA, normal appearance of eyes, ears, nose and mouth. moist mucus membranes.  NECK: supple, no masses or lymphadenopathy  LUNGS: clear to auscultation bilaterally, no rales, rhonchi or wheeze  CV: HRRR, no peripheral edema or cyanosis, normal pedal pulses  BREAST: normal appearance - no lesions or discharge, on palpation normal breast tissue without any suspicious masses  ABDOMEN: bowel sounds normal, soft, non tender to palpation, no masses, no rebound or guarding  GU: normal appearance of external genitalia - no lesions or masses, normal vaginal mucosa - no abnormal discharge, normal appearance of cervix - no lesions or abnormal discharge, no masses or tenderness on palpation of uterus and ovaries.  RECTAL: declined  SKIN: no rash or abnormal lesions; nevus on L face 7.5x75mm  MS: normal gait, moves all extremities normally  NEURO: CN II-XII grossly intact, normal muscle strength and sensation to light touch on  extremities  PSYCH: normal affect, pleasant and cooperative  ASSESSMENT AND PLAN:  Discussed the following assessment and plan:  Visit for preventive health examination - Plan: Lipid Panel, Hemoglobin A1c  History of abnormal cervical Pap smear, s/p remote LEEP, normal paps since  Colon cancer screening  Breast cancer screening  Cervical cancer screening   -Discussed and advised all [place] preventive services health task force level A and B recommendations for age, sex and risks.  -Advised at least 150 minutes of exercise per week and a healthy diet low in saturated fats and sweets and consisting of fresh fruits and vegetables, lean meats such as fish and white chicken and whole grains.  -FASTING labs, studies and vaccines per orders this encounter  Orders Placed This Encounter  Procedures  . Lipid Panel  . Hemoglobin A1c    Patient advised to return to clinic immediately if symptoms worsen or persist or new concerns.  Patient Instructions  BEFORE YOU LEAVE: -flu vaccine -stool cards -labs  Please call today to schedule your mammogram - number provided on card given at your visit  Complete the stool cards per instructions as soon as possible  -We have ordered labs or and a pap at this visit. It can take up to 1-2 weeks for results and processing. We will contact you with instructions IF your results are abnormal. Normal results will be released to your [facility]. If you have not heard from [place] or can not find your results in [facility] in 2 weeks please contact our office.         We recommend the following healthy lifestyle measures: - eat a healthy diet consisting of lots of vegetables, fruits, beans, nuts, seeds, healthy meats such as white chicken and fish and whole grains.  - avoid fried foods, fast food, processed foods, sodas, red meet and other fattening foods.  - get a least 150 minutes of aerobic exercise per week.      No Follow-up on file.  Colin Benton  R.

## 2014-08-19 NOTE — Addendum Note (Signed)
Addended by: Lahoma Crocker A on: 08/19/2014 11:11 AM   Modules accepted: Orders

## 2014-08-19 NOTE — Progress Notes (Signed)
Pre visit review using our clinic review tool, if applicable. No additional management support is needed unless otherwise documented below in the visit note. 

## 2014-08-19 NOTE — Patient Instructions (Addendum)
BEFORE YOU LEAVE: -flu vaccine -stool cards -labs  Please call today to schedule your mammogram - number provided on card given at your visit  Complete the stool cards per instructions as soon as possible  -We have ordered labs or and a pap at this visit. It can take up to 1-2 weeks for results and processing. We will contact you with instructions IF your results are abnormal. Normal results will be released to your [facility]. If you have not heard from [place] or can not find your results in [facility] in 2 weeks please contact our office.         We recommend the following healthy lifestyle measures: - eat a healthy diet consisting of lots of vegetables, fruits, beans, nuts, seeds, healthy meats such as white chicken and fish and whole grains.  - avoid fried foods, fast food, processed foods, sodas, red meet and other fattening foods.  - get a least 150 minutes of aerobic exercise per week.

## 2014-08-20 LAB — CYTOLOGY - PAP

## 2014-08-31 ENCOUNTER — Other Ambulatory Visit: Payer: BLUE CROSS/BLUE SHIELD

## 2014-10-01 ENCOUNTER — Other Ambulatory Visit: Payer: Self-pay

## 2014-10-01 DIAGNOSIS — Z1231 Encounter for screening mammogram for malignant neoplasm of breast: Secondary | ICD-10-CM

## 2014-10-07 ENCOUNTER — Ambulatory Visit
Admission: RE | Admit: 2014-10-07 | Discharge: 2014-10-07 | Disposition: A | Discharge status: Home / Self Care | Payer: BLUE CROSS/BLUE SHIELD | Source: Ambulatory Visit

## 2014-10-07 ENCOUNTER — Encounter (INDEPENDENT_AMBULATORY_CARE_PROVIDER_SITE_OTHER): Payer: Self-pay

## 2014-10-07 DIAGNOSIS — Z1231 Encounter for screening mammogram for malignant neoplasm of breast: Secondary | ICD-10-CM

## 2015-06-14 ENCOUNTER — Telehealth: Payer: Self-pay | Admitting: *Deleted

## 2015-06-14 NOTE — Telephone Encounter (Signed)
Spoke with patient. Patient states she is feeling much better and eye is slowly getting better. Advised to call our office if symptoms worsen or has any questions. Patient verbalized understanding.  -----------------------------------------------------------------------------------------------------------------------------------------------------------------------------------------------  PLEASE NOTE: All timestamps contained within this report are represented as [place] Standard Time. CONFIDENTIALTY NOTICE: This fax transmission is intended only for the addressee. It contains information that is legally privileged, confidential or otherwise protected from use or disclosure. If you are not the intended recipient, you are strictly prohibited from reviewing, disclosing, copying using or disseminating any of this information or taking any action in reliance on or regarding this information. If you have received this fax in error, please notify us immediately by telephone so that we can arrange for its return to [place]. Phone: [phone], Toll-Free: [phone], Fax: [phone] Page: 1 of 3 Call Id: [id number] [facility] Night - Client [place] Patient Name: Brittany Shepherd Gender: Female DOB: [birth year] Age: 54 Y 1 M 3 D Return Phone Number: [phone] (Primary) Address: City/State/Zip: [place] [address] Client [facility] Night - Client Client Site [facility] - Night Physician Colin Benton Contact Type Call Call Type Triage / Clinical Caller Name Letina Daughtry Relationship To Patient Self Return Phone Number [phone] (Primary) Chief Complaint Eye Pus Or Discharge Initial Comment Eye discharge, redness PreDisposition Call Doctor Nurse Assessment Nurse: Doyle Askew, RN, Joelene Millin Date/Time Eilene Ghazi Time): 06/11/2015 8:04:17 PM Confirm and document reason for call. If  symptomatic, describe symptoms. ---caller states eye discharge, redness. right eye. has gotten worse over the day. Sclera red. around a lot of people who are sick. She doesn't have any cold symptoms. Has the patient traveled out of the country within the last 30 days? ---Not Applicable Does the patient have any new or worsening symptoms? ---Yes Will a triage be completed? ---Yes Related visit to physician within the last 2 weeks? ---No Does the PT have any chronic conditions? (i.e. diabetes, asthma, etc.) ---No Did the patient indicate they were pregnant? ---No Is this a behavioral health or substance abuse call? ---No Guidelines Guideline Title Affirmed Question Affirmed Notes Nurse Date/Time (Eastern Time) Eye - Pus or Discharge [1] Eye with yellow/green discharge or eyelashes stick together AND [2] PCP standing order to call in antibiotic eye drops (all triage questions negative) Doyle Askew, RN, Joelene Millin 06/11/2015 8:07:29 PM Disp. Time Eilene Ghazi Time) Disposition Final User 06/11/2015 8:21:39 PM Pharmacy Call Doyle Askew, RN, Joelene Millin Reason: [facility] [phone] spoke with Sarah 06/11/2015 8:21:53 PM Clinical Call Doyle Askew, RN, Joelene Millin PLEASE NOTE: All timestamps contained within this report are represented as [place] Standard Time. CONFIDENTIALTY NOTICE: This fax transmission is intended only for the addressee. It contains information that is legally privileged, confidential or otherwise protected from use or disclosure. If you are not the intended recipient, you are strictly prohibited from reviewing, disclosing, copying using or disseminating any of this information or taking any action in reliance on or regarding this information. If you have received this fax in error, please notify us immediately by telephone so that we can arrange for its return to [place]. Phone: [phone], Toll-Free: [phone], Fax: [phone] Page: 2 of 3 Call Id: [id number] 06/11/2015 8:16:20 [place], RN, Renea Ee Understands: Yes Disagree/Comply: Comply Care Advice Given Per Guideline HOME CARE: You should be able to treat this at home. REASSURANCE: * 'Pink Eye' is a common complication of a cold or it can be acquired from exposure to a child or adult  who has had it recently. * Pink-eye responds to home treatment with antibiotic eyedrops and is not harmful to vision. EYELID CLEANSING: * Gently wash eyelids and lashes with warm water and wet cotton balls (or cotton gauze). Remove all the dried and liquid pus. * Do this as often as needed. CONTAGIOUSNESS: * Pink-eye is contagious. Try not to touch your eyes. Wash your hands frequently. Do not share towels. CALL BACK IF: * Pus lasts over 3 days (72 hours) on treatment * Blurred vision develops * More than just mild discomfort * You become worse CARE ADVICE given per Eye - Pus or Discharge (Adult) guideline. Nurse will call in antibacterial eye drops to [facility] After Care Instructions Given Call Event Type User Date / Time Description Standing Orders Preparation Additional Instructions Route Frequency Duration Nurse Comments User Name Polytrim Eye Drops 2 drops both eyes Eye Four Times Daily 5 Days Doyle Askew, RN, Joelene Millin Polytrim Eye Drops 2 drops both eyes > 3 months, DO NOT use if fever, eyelid redness or edema Eye Four Times Daily 5 Days Doyle Askew, RN, Joelene Millin PLEASE NOTE: All timestamps contained within this report are represented as [place] Standard Time. CONFIDENTIALTY NOTICE: This fax transmission is intended only for the addressee. It contains information that is legally privileged, confidential or otherwise protected from use or disclosure. If you are not the intended recipient, you are strictly prohibited from reviewing, disclosing, copying using or disseminating any of this information or taking any action in reliance on or regarding this information. If you have received this fax in error, please notify us  immediately by telephone so that we can arrange for its return to [place]. Phone: [phone], Toll-Free: [phone], Fax: [phone] Page: 3 of 3 Call Id: [phone] [facility] [address] [place] [address] [phone] Fax: [phone] [facility] - Client [facility] - Night Date: 06/11/2015 From: QI Department To: Colin Benton This is an approved standing order given by our call center nurse on your behalf. Fax to [phone] within 5 business days. Thank you. Date Eilene Ghazi Time): 06/11/2015 6:28:17 PM Triage RN: Jenny Reichmann, RN NAME: Altamese Dilling PHONE NUMBER: [phone] (Primary) BIRTHDATE: 28-May-1961 [place] [address] CALLER: Self NAME: Carin Hock Rx Given Preparation Additional Instructions Route Frequency Duration Nurse Comments User Name Polytrim Eye Drops 2 drops both eyes Eye Four Times Daily 5 Days Doyle Askew, RN, Joelene Millin Polytrim Eye Drops 2 drops both eyes > 3 months, DO NOT use if fever, eyelid redness or edema Eye Four Times Daily 5 Days Doyle Askew, RN, Joelene Millin No signature is required on standing orders.

## 2016-04-26 NOTE — Progress Notes (Signed)
HPI:  Here for CPE. At last visit advised 3-4 month follow up, had lost job and we have not seen her in over a year and a half. Sober > 4 years. Likes her job. She has a hx of B12 def when was alcoholic and wants to check b12. No symptoms.  -Taking folic acid, vitamin D or calcium: no  -Diabetes and Dyslipidemia Screening: done 08/2014 - mild pre diabetes and elevated cholesterol  -Vaccines: flu shot today  -pap history: 08/2014 normal pap and hpv negative   -wants STI testing (Hep C if born [birth year]): no  -FH breast, colon or ovarian ca: see FH Last mammogram: 10/2014 Last colon cancer screening: she wanted stool cards at last visit - but never return these  -Alcohol, Tobacco, drug use: see social history  Review of Systems - no fevers, unintentional weight loss, vision loss, hearing loss, chest pain, sob, hemoptysis, melena, hematochezia, hematuria, genital discharge, changing or concerning skin lesions, bleeding, bruising, loc, thoughts of self harm or SI  Past Medical History:  Diagnosis Date  . Alcohol abuse   . Anxiety   . Anxiety - followed [facility] 04/22/2012  . Chicken pox   . Depression   . Eating disorder   . GERD (gastroesophageal reflux disease)   . Headache(784.0)    frequent  . Hepatitis   . History of abnormal cervical Pap smear, s/p remote LEEP, normal paps since 04/22/2012  . History of pregnancy, G2P2 04/22/2012  . Hypertension    high blood pressure readings  . Jaundice   . Kidney stones   . Mental disorder   . Nevus    L face, eval by derm in the past, told cosmetic  . Shoulder arthritis - has seen [facility] 04/22/2012  . Urine incontinence     Past Surgical History:  Procedure Laterality Date  . urethrea stretched  age 13    Family History  Problem Relation Age of Onset  . Arthritis Other     paternal great grandfather  . Arthritis Other     parent  . Alcohol abuse Other     paternal and maternal uncle  . Prostate  cancer Paternal Grandfather   . Hyperlipidemia Mother   . Heart disease Maternal Grandmother   . Hypertension    . Mental illness    . Diabetes    . ADD / ADHD Brother   . ADD / ADHD Son     Social History   Social History  . Marital status: Single    Spouse name: N/A  . Number of children: N/A  . Years of education: N/A   Social History Main Topics  . Smoking status: Never Smoker  . Smokeless tobacco: Never Used  . Alcohol use Yes     Comment: per patient vodka   . Drug use: No  . Sexual activity: Yes   Other Topics Concern  . None   Social History Narrative  . None     Current Outpatient Prescriptions:  .  acetaminophen (TYLENOL) 500 MG tablet, Take 500 mg by mouth every 6 (six) hours as needed., Disp: , Rfl:  .  vitamin B-12 (CYANOCOBALAMIN) 1000 MCG tablet, Take 2,500 mcg by mouth daily., Disp: , Rfl:   EXAM:  Vitals:   04/27/16 0827  Pulse: 83  Temp: 98.1 F (36.7 C)    GENERAL: vitals reviewed and listed below, alert, oriented, appears well hydrated and in no acute distress  HEENT: head atraumatic, PERRLA, normal appearance  of eyes, ears, nose and mouth. moist mucus membranes.  NECK: supple, no masses or lymphadenopathy  LUNGS: clear to auscultation bilaterally, no rales, rhonchi or wheeze  CV: HRRR, no peripheral edema or cyanosis, normal pedal pulses  BREAST: declined  ABDOMEN: bowel sounds normal, soft, non tender to palpation, no masses, no rebound or guarding  GU: declined  SKIN: no rash or abnormal lesions; approx 7 mm nevus L temple  MS: normal gait, moves all extremities normally  NEURO: normal gait, speech and thought processing grossly intact, muscle tone grossly intact throughout  PSYCH: normal affect, pleasant and cooperative  ASSESSMENT AND PLAN:  Discussed the following assessment and plan:  Visit for preventive health examination - Plan: Lipid Panel, Comprehensive metabolic panel, CANCELED: CMP with eGFR  Hx of non  anemic vitamin B12 deficiency - Plan: Vitamin B12  Colon cancer screening  Need for hepatitis C screening test - Plan: Hep C Antibody  Encounter for immunization - Plan: Flu Vaccine QUAD 36+ mos IM   -Discussed and advised all [place] preventive services health task force level A and B recommendations for age, sex and risks.  -Advised at least 150 minutes of exercise per week and a healthy diet   -labs, studies and vaccines per orders this encounter  Orders Placed This Encounter  Procedures  . Flu Vaccine QUAD 36+ mos IM  . Lipid Panel  . Hep C Antibody  . Vitamin B12  . Comprehensive metabolic panel    Patient advised to return to clinic immediately if symptoms worsen or persist or new concerns.  Patient Instructions  BEFORE YOU LEAVE: -follow up: yearly for physical and as needed -flu shot -order cologuard -labs  Please schedule your mammogram.  Please complete the cologuard test for colon cancer screening.  Vit D3 [phone] daily  We have ordered labs or studies at this visit. It can take up to 1-2 weeks for results and processing. IF results require follow up or explanation, we will call you with instructions. Clinically stable results will be released to your [facility]. If you have not heard from [place] or cannot find your results in [facility] in 2 weeks please contact our office at [phone].  If you are not yet signed up for [facility], please consider signing up.   We recommend the following healthy lifestyle for LIFE:  Eat a healthy clean diet.  * Tip: Avoid (less then 1 serving per week): processed foods, sweets, sweetened drinks, white starches (rice, flour, bread, potatoes, pasta, etc), red meat, fast foods, butter  *Tip: CHOOSE instead   * 5-9 servings per day of fresh or frozen fruits and vegetables (but not corn, potatoes, bananas, canned or dried fruit)   *nuts and seeds, beans   *olives and olive oil   *small portions of lean meats such as fish and white  chicken    *small portions of whole grains  Get at least 150 minutes of sweaty aerobic exercise per week.  Reduce stress - consider counseling, meditation and relaxation to balance other aspects of your life.          No Follow-up on file.  Colin Benton R., DO

## 2016-04-27 ENCOUNTER — Encounter: Payer: Self-pay | Admitting: Family Medicine

## 2016-04-27 ENCOUNTER — Ambulatory Visit (INDEPENDENT_AMBULATORY_CARE_PROVIDER_SITE_OTHER): Payer: BLUE CROSS/BLUE SHIELD | Admitting: Family Medicine

## 2016-04-27 VITALS — HR 83 | Temp 98.1°F | Ht 62.75 in | Wt 132.3 lb

## 2016-04-27 DIAGNOSIS — Z0000 Encounter for general adult medical examination without abnormal findings: Secondary | ICD-10-CM | POA: Diagnosis not present

## 2016-04-27 DIAGNOSIS — Z1211 Encounter for screening for malignant neoplasm of colon: Secondary | ICD-10-CM | POA: Diagnosis not present

## 2016-04-27 DIAGNOSIS — Z8639 Personal history of other endocrine, nutritional and metabolic disease: Secondary | ICD-10-CM | POA: Diagnosis not present

## 2016-04-27 DIAGNOSIS — Z23 Encounter for immunization: Secondary | ICD-10-CM

## 2016-04-27 DIAGNOSIS — Z1159 Encounter for screening for other viral diseases: Secondary | ICD-10-CM | POA: Diagnosis not present

## 2016-04-27 LAB — COMPREHENSIVE METABOLIC PANEL
ALBUMIN: 4.6 g/dL (ref 3.5–5.2)
ALT: 12 U/L (ref 0–35)
AST: 13 U/L (ref 0–37)
Alkaline Phosphatase: 58 U/L (ref 39–117)
BUN: 12 mg/dL (ref 6–23)
CALCIUM: 10.3 mg/dL (ref 8.4–10.5)
CHLORIDE: 105 meq/L (ref 96–112)
CO2: 31 mEq/L (ref 19–32)
CREATININE: 0.68 mg/dL (ref 0.40–1.20)
GFR: 95.85 mL/min (ref >60.00)
Glucose, Bld: 97 mg/dL (ref 70–99)
POTASSIUM: 4.9 meq/L (ref 3.5–5.1)
Sodium: 143 mEq/L (ref 135–145)
Total Bilirubin: 0.5 mg/dL (ref 0.2–1.2)
Total Protein: 7 g/dL (ref 6.0–8.3)

## 2016-04-27 LAB — LIPID PANEL
CHOL/HDL RATIO: 5
Cholesterol: 252 mg/dL — ABNORMAL HIGH (ref 0–200)
HDL: 55.7 mg/dL (ref >39.00)
LDL CALC: 165 mg/dL — AB (ref 0–99)
NonHDL: 196.18
TRIGLYCERIDES: 157 mg/dL — AB (ref 0.0–149.0)
VLDL: 31.4 mg/dL (ref 0.0–40.0)

## 2016-04-27 LAB — VITAMIN B12: Vitamin B-12: 262 pg/mL (ref 211–911)

## 2016-04-27 NOTE — Patient Instructions (Signed)
BEFORE YOU LEAVE: -follow up: yearly for physical and as needed -flu shot -order cologuard -labs  Please schedule your mammogram.  Please complete the cologuard test for colon cancer screening.  Vit D3 [phone] IU daily  We have ordered labs or studies at this visit. It can take up to 1-2 weeks for results and processing. IF results require follow up or explanation, we will call you with instructions. Clinically stable results will be released to your [facility]. If you have not heard from [place] or cannot find your results in [facility] in 2 weeks please contact our office at [phone].  If you are not yet signed up for [facility], please consider signing up.   We recommend the following healthy lifestyle for LIFE:  Eat a healthy clean diet.  * Tip: Avoid (less then 1 serving per week): processed foods, sweets, sweetened drinks, white starches (rice, flour, bread, potatoes, pasta, etc), red meat, fast foods, butter  *Tip: CHOOSE instead   * 5-9 servings per day of fresh or frozen fruits and vegetables (but not corn, potatoes, bananas, canned or dried fruit)   *nuts and seeds, beans   *olives and olive oil   *small portions of lean meats such as fish and white chicken    *small portions of whole grains  Get at least 150 minutes of sweaty aerobic exercise per week.  Reduce stress - consider counseling, meditation and relaxation to balance other aspects of your life.

## 2016-04-27 NOTE — Progress Notes (Signed)
Pre visit review using our clinic review tool, if applicable. No additional management support is needed unless otherwise documented below in the visit note. 

## 2016-04-28 LAB — HEPATITIS C ANTIBODY: HCV Ab: NEGATIVE

## 2016-05-21 ENCOUNTER — Encounter: Payer: Self-pay | Admitting: Family Medicine

## 2016-05-31 LAB — COLOGUARD

## 2016-06-02 ENCOUNTER — Telehealth: Payer: Self-pay | Admitting: *Deleted

## 2016-06-02 DIAGNOSIS — R195 Other fecal abnormalities: Secondary | ICD-10-CM

## 2016-06-02 NOTE — Telephone Encounter (Signed)
Dr. Maudie Mercury receive the cologuard test which was positive. I called the pt and informed her of the results and per Dr. Maudie Mercury a referral was placed for a GI appointment to have a colonoscopy and the pt is aware and someone will call her with appointment info.

## 2016-06-30 ENCOUNTER — Encounter: Payer: Self-pay | Admitting: Internal Medicine

## 2016-08-11 ENCOUNTER — Ambulatory Visit (AMBULATORY_SURGERY_CENTER): Payer: Self-pay | Admitting: *Deleted

## 2016-08-11 DIAGNOSIS — R195 Other fecal abnormalities: Secondary | ICD-10-CM

## 2016-08-11 MED ORDER — POLYETHYLENE GLYCOL 3350 17 GM/SCOOP PO POWD: 1.0000 | Freq: Once | ORAL | Status: DC

## 2016-08-11 NOTE — Progress Notes (Signed)
Pt denies allergies to eggs or soy products. Denies difficulty with sedation or anesthesia. Denies any diet or weight loss medications. Denies use of supplemental oxygen.  Patient doesn't want video.

## 2016-08-14 ENCOUNTER — Encounter: Payer: Self-pay | Admitting: Internal Medicine

## 2016-08-25 ENCOUNTER — Encounter: Payer: Self-pay | Admitting: Internal Medicine

## 2016-08-25 ENCOUNTER — Ambulatory Visit (AMBULATORY_SURGERY_CENTER): Payer: BLUE CROSS/BLUE SHIELD | Admitting: Internal Medicine

## 2016-08-25 VITALS — BP 118/64 | HR 72 | Temp 97.3°F | Resp 21 | Ht 63.75 in | Wt 132.0 lb

## 2016-08-25 DIAGNOSIS — R195 Other fecal abnormalities: Secondary | ICD-10-CM | POA: Diagnosis not present

## 2016-08-25 DIAGNOSIS — D120 Benign neoplasm of cecum: Secondary | ICD-10-CM

## 2016-08-25 MED ORDER — SODIUM CHLORIDE 0.9 % IV SOLN: 500.0000 mL | INTRAVENOUS | Status: DC

## 2016-08-25 NOTE — Patient Instructions (Addendum)
I found and removed a small polyp. You have some hemorrhoids. Either of these could have been source of + Cologuard.  You also have a condition called diverticulosis - common and not usually a problem. Please read the handout provided.  I will let you know pathology results and when to have another routine colonoscopy by mail.  I appreciate the opportunity to care for you. Gatha Mayer, MD, [facility]   Handouts : Hemorrhoids, Diverticulosis, and Polyps.  YOU HAD AN ENDOSCOPIC PROCEDURE TODAY AT [facility]:   Refer to the procedure report that was given to you for any specific questions about what was found during the examination.  If the procedure report does not answer your questions, please call your gastroenterologist to clarify.  If you requested that your care partner not be given the details of your procedure findings, then the procedure report has been included in a sealed envelope for you to review at your convenience later.  YOU SHOULD EXPECT: Some feelings of bloating in the abdomen. Passage of more gas than usual.  Walking can help get rid of the air that was put into your GI tract during the procedure and reduce the bloating. If you had a lower endoscopy (such as a colonoscopy or flexible sigmoidoscopy) you may notice spotting of blood in your stool or on the toilet paper. If you underwent a bowel prep for your procedure, you may not have a normal bowel movement for a few days.  Please Note:  You might notice some irritation and congestion in your nose or some drainage.  This is from the oxygen used during your procedure.  There is no need for concern and it should clear up in a day or so.  SYMPTOMS TO REPORT IMMEDIATELY:   Following lower endoscopy (colonoscopy or flexible sigmoidoscopy):  Excessive amounts of blood in the stool  Significant tenderness or worsening of abdominal pains  Swelling of the abdomen that is new, acute  Fever of 100F or  higher   For urgent or emergent issues, a gastroenterologist can be reached at any hour by calling [phone].   DIET:  We do recommend a small meal at first, but then you may proceed to your regular diet.  Drink plenty of fluids but you should avoid alcoholic beverages for 24 hours.  ACTIVITY:  You should plan to take it easy for the rest of today and you should NOT DRIVE or use heavy machinery until tomorrow (because of the sedation medicines used during the test).    FOLLOW UP: Our staff will call the number listed on your records the next business day following your procedure to check on you and address any questions or concerns that you may have regarding the information given to you following your procedure. If we do not reach you, we will leave a message.  However, if you are feeling well and you are not experiencing any problems, there is no need to return our call.  We will assume that you have returned to your regular daily activities without incident.  If any biopsies were taken you will be contacted by phone or by letter within the next 1-3 weeks.  Please call us at [phone] if you have not heard about the biopsies in 3 weeks.    SIGNATURES/CONFIDENTIALITY: You and/or your care partner have signed paperwork which will be entered into your electronic medical record.  These signatures attest to the fact that that the information above on  your After Visit Summary has been reviewed and is understood.  Full responsibility of the confidentiality of this discharge information lies with you and/or your care-partner. 

## 2016-08-25 NOTE — Progress Notes (Signed)
Called to room to assist during endoscopic procedure.  Patient ID and intended procedure confirmed with present staff. Received instructions for my participation in the procedure from the performing physician.  

## 2016-08-25 NOTE — Progress Notes (Signed)
To recovery vss report to [employer]

## 2016-08-25 NOTE — Op Note (Signed)
[facility] Patient Name: Esmeraldo Mayhall Procedure Date: 08/25/2016 11:28 AM MRN: [id number] Endoscopist: Gatha Mayer , MD Age: 56 Referring MD:  Date of Birth: 24-Feb-1961 Gender: Female Account #: [id number] Procedure:                Colonoscopy Indications:              Positive Cologuard test Medicines:                Propofol per Anesthesia, Monitored Anesthesia Care Procedure:                Pre-Anesthesia Assessment:                           - Prior to the procedure, a History and Physical                            was performed, and patient medications and                            allergies were reviewed. The patient's tolerance of                            previous anesthesia was also reviewed. The risks                            and benefits of the procedure and the sedation                            options and risks were discussed with the patient.                            All questions were answered, and informed consent                            was obtained. Prior Anticoagulants: The patient has                            taken no previous anticoagulant or antiplatelet                            agents. ASA Grade Assessment: II - A patient with                            mild systemic disease. After reviewing the risks                            and benefits, the patient was deemed in                            satisfactory condition to undergo the procedure.                           After obtaining informed consent, the colonoscope  was passed under direct vision. Throughout the                            procedure, the patient's blood pressure, pulse, and                            oxygen saturations were monitored continuously. The                            Model CF-HQ190L [id number]) scope was introduced                            through the anus and advanced to the the cecum,   identified by appendiceal orifice and ileocecal                            valve. The colonoscopy was performed without                            difficulty. The patient tolerated the procedure                            well. The quality of the bowel preparation was                            excellent. The bowel preparation used was Miralax.                            The ileocecal valve, appendiceal orifice, and                            rectum were photographed. Scope In: 11:33:27 AM Scope Out: 11:51:36 AM Scope Withdrawal Time: 0 hours 13 minutes 56 seconds  Total Procedure Duration: 0 hours 18 minutes 9 seconds  Findings:                 The perianal and digital rectal examinations were                            normal.                           A diminutive polyp was found in the cecum. The                            polyp was sessile. The polyp was removed with a                            cold snare. Resection and retrieval were complete.                            Verification of patient identification for the                            specimen was done. Estimated blood loss  was minimal.                           Multiple small and large-mouthed diverticula were                            found in the sigmoid colon.                           Internal hemorrhoids were found during retroflexion.                           The exam was otherwise without abnormality on                            direct and retroflexion views. Complications:            No immediate complications. Estimated Blood Loss:     Estimated blood loss was minimal. Impression:               - One diminutive polyp in the cecum, removed with a                            cold snare. Resected and retrieved.                           - Diverticulosis in the sigmoid colon.                           - Internal hemorrhoids.                           - The examination was otherwise normal on direct                             and retroflexion views. Recommendation:           - Patient has a contact number available for                            emergencies. The signs and symptoms of potential                            delayed complications were discussed with the                            patient. Return to normal activities tomorrow.                            Written discharge instructions were provided to the                            patient.                           - Resume previous diet.                           -  Continue present medications.                           - Repeat colonoscopy is recommended. The                            colonoscopy date will be determined after pathology                            results from today's exam become available for                            review. Gatha Mayer, MD 08/25/2016 11:57:41 AM This report has been signed electronically.

## 2016-08-25 NOTE — Progress Notes (Signed)
Pt's states no medical or surgical changes since previsit or office visit. 

## 2016-08-25 NOTE — Progress Notes (Signed)
Upon arriving to recovery and receiving report pt. Noted with grimacing,stated stomach cramping repositioned pt. To right side and back to left pt knees bent toward chest and pt. Passed large amount of air pain level of 8. 1218 reassess abdomen which remain soft and pt. Passing air  But still C/O pain level of 6/7 hyoscyamine 0.125 mg x2given at that time and pt. Began expelling large amount of air. 1226 pt continues to pass large amount of air and is joking and laughing pain level has decreased to 1 and stated I feel more gas coming.1231 pt. Continues to release large amount of air and stated I think I can go home now. 1236 asked pt. How she feels and she stated great,pt. Discharge to home.

## 2016-08-28 ENCOUNTER — Telehealth: Payer: Self-pay

## 2016-08-28 ENCOUNTER — Telehealth: Payer: Self-pay | Admitting: *Deleted

## 2016-08-28 NOTE — Telephone Encounter (Signed)
Attempted to reach pt. With follow up call following endoscopic procedure on 08/25/2016.   LM on pt. Ans. Machine.  Will try to reach pt. Again later today.

## 2016-08-28 NOTE — Telephone Encounter (Signed)
  Follow up Call-  Call back number 08/25/2016  Post procedure Call Back phone  # [phone]  Permission to leave phone message Yes  Some recent data might be hidden     Patient questions:  Do you have a fever, pain , or abdominal swelling? No. Pain Score  0 *  Have you tolerated food without any problems? Yes.    Have you been able to return to your normal activities? Yes.    Do you have any questions about your discharge instructions: Diet   No. Medications  No. Follow up visit  No.  Do you have questions or concerns about your Care? No.  Actions: * If pain score is 4 or above: No action needed, pain <4.

## 2016-08-31 ENCOUNTER — Encounter: Payer: Self-pay | Admitting: Internal Medicine

## 2016-08-31 DIAGNOSIS — Z86010 Personal history of colonic polyps: Secondary | ICD-10-CM

## 2016-08-31 DIAGNOSIS — Z860101 Personal history of adenomatous and serrated colon polyps: Secondary | ICD-10-CM

## 2016-08-31 HISTORY — DX: Personal history of adenomatous and serrated colon polyps: Z86.0101

## 2016-08-31 HISTORY — DX: Personal history of colonic polyps: Z86.010

## 2016-08-31 NOTE — Progress Notes (Signed)
Diminutive adenoma Recall 2023

## 2016-09-03 NOTE — Progress Notes (Signed)
HPI:  Follow up low B12 and hyperlipidemia. Oral B12 supplement and Mediterranean diet with regular aerobic exercise advised. This was an appointment to also discuss adding a medication for cholesterol. However, she does not want to take a medication or recheck her labs. Reports she has not made any lifestyle changes but plans to do so and would like to recheck later this year instead.  She has a new issues of sinus congestion. This started about 2 weeks ago. She does have allergy issues. Has had nasal congestion, fulness in ears, some sinus discomfort at times, PND. No fevers, SOb, wheezing, body aches, NVD. Most nasal congestion has been clear - occasionally yellow.  ROS: See pertinent positives and negatives per HPI.  Past Medical History:  Diagnosis Date  . Alcohol abuse   . Anxiety   . Anxiety - followed [facility] 04/22/2012  . Arthritis    shoulder  . Chicken pox   . Depression   . Eating disorder   . GERD (gastroesophageal reflux disease)   . Headache(784.0)    frequent  . Hepatitis   . History of abnormal cervical Pap smear, s/p remote LEEP, normal paps since 04/22/2012  . History of pregnancy, G2P2 04/22/2012  . Hx of adenomatous polyp of colon 08/31/2016  . Hypertension    high blood pressure readings/white coat syndrome  . Jaundice   . Kidney stones   . Mental disorder   . Nevus    L face, eval by derm in the past, told cosmetic  . Shoulder arthritis - has seen [facility] 04/22/2012  . Urine incontinence     Past Surgical History:  Procedure Laterality Date  . urethrea stretched  age 59    Family History  Problem Relation Age of Onset  . Arthritis Other     paternal great grandfather  . Arthritis Other     parent  . Alcohol abuse Other     paternal and maternal uncle  . Prostate cancer Paternal Grandfather   . Hyperlipidemia Mother   . Heart disease Maternal Grandmother   . Hypertension    . Mental illness    . Diabetes    . ADD / ADHD  Brother   . ADD / ADHD Son   . Colon cancer Neg Hx     Social History   Social History  . Marital status: Single    Spouse name: N/A  . Number of children: N/A  . Years of education: N/A   Social History Main Topics  . Smoking status: Never Smoker  . Smokeless tobacco: Never Used  . Alcohol use Yes     Comment: per patient vodka Benard Halsted for four months  . Drug use: No  . Sexual activity: Yes   Other Topics Concern  . None   Social History Narrative  . None     Current Outpatient Prescriptions:  .  acetaminophen (TYLENOL) 500 MG tablet, Take 500 mg by mouth every 6 (six) hours as needed., Disp: , Rfl:  .  loratadine (CLARITIN) 10 MG tablet, Take 10 mg by mouth daily., Disp: , Rfl:  .  vitamin B-12 (CYANOCOBALAMIN) 1000 MCG tablet, Take 2,500 mcg by mouth daily., Disp: , Rfl:  .  amoxicillin-clavulanate (AUGMENTIN) 875-125 MG tablet, Take 1 tablet by mouth 2 (two) times daily., Disp: 14 tablet, Rfl: 0  Current Facility-Administered Medications:  .  0.9 %  sodium chloride infusion, 500 mL, Intravenous, Continuous, Gatha Mayer, MD  EXAM:  Vitals:  09/04/16 0801  BP: 100/76  Pulse: 63  Temp: 98.2 F (36.8 C)    Body mass index is 23.42 kg/m.  GENERAL: vitals reviewed and listed above, alert, oriented, appears well hydrated and in no acute distress  HEENT: atraumatic, conjunttiva clear, no obvious abnormalities on inspection of external nose and ears, normal appearance of ear canals and TMs, clear nasal congestion, mild post oropharyngeal erythema with PND, no tonsillar edema or exudate, no sinus TTP  NECK: no obvious masses on inspection  LUNGS: clear to auscultation bilaterally, no wheezes, rales or rhonchi, good air movement  CV: HRRR, no peripheral edema  MS: moves all extremities without noticeable abnormality  PSYCH: pleasant and cooperative, no obvious depression or anxiety  ASSESSMENT AND PLAN:  Discussed the following assessment and  plan:  Rhinosinusitis -discussed potential etiologies and may be AR vs VURI with possible developing sinusitis. -she opted to try short course nasal decongestant and allergy regimen, if not improving an abx/risks discussed -return precautions  Hyperlipidemia, unspecified hyperlipidemia type -refuses to recheck or consider medication now - wants to work on lifestyle changes  Low serum vitamin B12 -taking oral b12 and reports has helped energy  -Patient advised to return or notify a doctor immediately if symptoms worsen or persist or new concerns arise.  Patient Instructions  BEFORE YOU LEAVE: -follow up: 4-6 months, come fasting and we will plan to schedule labs then  Fo the sinus/allergy issues: Try AFRIN nasal spray for 3-4 days. Do not use longer. Also, Flonase 2 sprays each nostril daily for 3-4 weeks. Continue the allergy pill. If worsening or not improving take the antibiotic as instructed.  I hope you are feeling better soon! Seek care immediately if worsening, new concerns or you are not improving with treatment.   We recommend the following healthy lifestyle for LIFE: 1) Small portions.   Tip: eat off of a salad plate instead of a dinner plate.  Tip: if you need more or a snack choose fruits, veggies and/or a handful of nuts or seeds.  2) Eat a healthy clean diet.  * Tip: Avoid (less then 1 serving per week): processed foods, sweets, sweetened drinks, white starches (rice, flour, bread, potatoes, pasta, etc), red meat, fast foods, butter  *Tip: CHOOSE instead   * 5-9 servings per day of fresh or frozen fruits and vegetables (but not corn, potatoes, bananas, canned or dried fruit)   *nuts and seeds, beans   *olives and olive oil   *small portions of lean meats such as fish and white chicken    *small portions of whole grains  3)Get at least 150 minutes of sweaty aerobic exercise per week.  4)Reduce stress - consider counseling, meditation and relaxation to balance  other aspects of your life.    Colin Benton R., DO

## 2016-09-04 ENCOUNTER — Ambulatory Visit (INDEPENDENT_AMBULATORY_CARE_PROVIDER_SITE_OTHER): Payer: BLUE CROSS/BLUE SHIELD | Admitting: Family Medicine

## 2016-09-04 ENCOUNTER — Encounter: Payer: Self-pay | Admitting: Family Medicine

## 2016-09-04 VITALS — BP 100/76 | HR 63 | Temp 98.2°F | Ht 63.75 in | Wt 135.4 lb

## 2016-09-04 DIAGNOSIS — J329 Chronic sinusitis, unspecified: Secondary | ICD-10-CM

## 2016-09-04 DIAGNOSIS — E785 Hyperlipidemia, unspecified: Secondary | ICD-10-CM | POA: Diagnosis not present

## 2016-09-04 DIAGNOSIS — R7989 Other specified abnormal findings of blood chemistry: Secondary | ICD-10-CM | POA: Diagnosis not present

## 2016-09-04 DIAGNOSIS — E538 Deficiency of other specified B group vitamins: Secondary | ICD-10-CM

## 2016-09-04 DIAGNOSIS — J310 Chronic rhinitis: Secondary | ICD-10-CM

## 2016-09-04 MED ORDER — AMOXICILLIN-POT CLAVULANATE 875-125 MG PO TABS: 1.0000 | ORAL_TABLET | Freq: Two times a day (BID) | ORAL | 0 refills | Status: DC

## 2016-09-04 NOTE — Patient Instructions (Signed)
BEFORE YOU LEAVE: -follow up: 4-6 months, come fasting and we will plan to schedule labs then  Fo the sinus/allergy issues: Try AFRIN nasal spray for 3-4 days. Do not use longer. Also, Flonase 2 sprays each nostril daily for 3-4 weeks. Continue the allergy pill. If worsening or not improving take the antibiotic as instructed.  I hope you are feeling better soon! Seek care immediately if worsening, new concerns or you are not improving with treatment.   We recommend the following healthy lifestyle for LIFE: 1) Small portions.   Tip: eat off of a salad plate instead of a dinner plate.  Tip: if you need more or a snack choose fruits, veggies and/or a handful of nuts or seeds.  2) Eat a healthy clean diet.  * Tip: Avoid (less then 1 serving per week): processed foods, sweets, sweetened drinks, white starches (rice, flour, bread, potatoes, pasta, etc), red meat, fast foods, butter  *Tip: CHOOSE instead   * 5-9 servings per day of fresh or frozen fruits and vegetables (but not corn, potatoes, bananas, canned or dried fruit)   *nuts and seeds, beans   *olives and olive oil   *small portions of lean meats such as fish and white chicken    *small portions of whole grains  3)Get at least 150 minutes of sweaty aerobic exercise per week.  4)Reduce stress - consider counseling, meditation and relaxation to balance other aspects of your life.

## 2016-09-04 NOTE — Progress Notes (Signed)
Pre visit review using our clinic review tool, if applicable. No additional management support is needed unless otherwise documented below in the visit note. 

## 2017-03-22 ENCOUNTER — Encounter: Payer: Self-pay | Admitting: Family Medicine
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
## 2017-05-11 ENCOUNTER — Ambulatory Visit (INDEPENDENT_AMBULATORY_CARE_PROVIDER_SITE_OTHER): Payer: BLUE CROSS/BLUE SHIELD | Admitting: Family Medicine

## 2017-05-11 ENCOUNTER — Encounter: Payer: Self-pay | Admitting: Family Medicine

## 2017-05-11 VITALS — BP 92/58 | HR 57 | Temp 98.0°F | Wt 120.9 lb

## 2017-05-11 DIAGNOSIS — Z1331 Encounter for screening for depression: Secondary | ICD-10-CM | POA: Diagnosis not present

## 2017-05-11 DIAGNOSIS — Z23 Encounter for immunization: Secondary | ICD-10-CM | POA: Diagnosis not present

## 2017-05-11 DIAGNOSIS — Z0000 Encounter for general adult medical examination without abnormal findings: Secondary | ICD-10-CM

## 2017-05-11 DIAGNOSIS — E538 Deficiency of other specified B group vitamins: Secondary | ICD-10-CM

## 2017-05-11 DIAGNOSIS — Z8639 Personal history of other endocrine, nutritional and metabolic disease: Secondary | ICD-10-CM | POA: Diagnosis not present

## 2017-05-11 LAB — BASIC METABOLIC PANEL
BUN: 13 mg/dL (ref 6–23)
CHLORIDE: 102 meq/L (ref 96–112)
CO2: 29 mEq/L (ref 19–32)
Calcium: 9.9 mg/dL (ref 8.4–10.5)
Creatinine, Ser: 0.63 mg/dL (ref 0.40–1.20)
GFR: 104.27 mL/min (ref >60.00)
GLUCOSE: 98 mg/dL (ref 70–99)
POTASSIUM: 4.1 meq/L (ref 3.5–5.1)
Sodium: 140 mEq/L (ref 135–145)

## 2017-05-11 LAB — LIPID PANEL
Cholesterol: 298 mg/dL — ABNORMAL HIGH (ref 0–200)
HDL: 57.6 mg/dL (ref >39.00)
LDL Cholesterol: 218 mg/dL — ABNORMAL HIGH (ref 0–99)
NONHDL: 240.48
TRIGLYCERIDES: 111 mg/dL (ref 0.0–149.0)
Total CHOL/HDL Ratio: 5
VLDL: 22.2 mg/dL (ref 0.0–40.0)

## 2017-05-11 LAB — HEMOGLOBIN A1C: Hgb A1c MFr Bld: 5.9 % (ref 4.6–6.5)

## 2017-05-11 LAB — VITAMIN B12: Vitamin B-12: 492 pg/mL (ref 211–911)

## 2017-05-11 NOTE — Patient Instructions (Signed)
BEFORE YOU LEAVE: -Flu shot -Labs -follow up: Yearly for physical  Get mammogram  We have ordered labs or studies at this visit. It can take up to 1-2 weeks for results and processing. IF results require follow up or explanation, we will call you with instructions. Clinically stable results will be released to your [facility]. If you have not heard from [place] or cannot find your results in [facility] in 2 weeks please contact our office at [phone].  If you are not yet signed up for [facility], please consider signing up.   Health Maintenance for Postmenopausal Women Menopause is a normal process in which your reproductive ability comes to an end. This process happens gradually over a span of months to years, usually between the ages of 60 and 28. Menopause is complete when you have missed 12 consecutive menstrual periods. It is important to talk with your health care provider about some of the most common conditions that affect postmenopausal women, such as heart disease, cancer, and bone loss (osteoporosis). Adopting a healthy lifestyle and getting preventive care can help to promote your health and wellness. Those actions can also lower your chances of developing some of these common conditions. What should I know about menopause? During menopause, you may experience a number of symptoms, such as:  Moderate-to-severe hot flashes.  Night sweats.  Decrease in sex drive.  Mood swings.  Headaches.  Tiredness.  Irritability.  Memory problems.  Insomnia.  Choosing to treat or not to treat menopausal changes is an individual decision that you make with your health care provider. What should I know about hormone replacement therapy and supplements? Hormone therapy products are effective for treating symptoms that are associated with menopause, such as hot flashes and night sweats. Hormone replacement carries certain risks, especially as you become older. If you are thinking about using  estrogen or estrogen with progestin treatments, discuss the benefits and risks with your health care provider. What should I know about heart disease and stroke? Heart disease, heart attack, and stroke become more likely as you age. This may be due, in part, to the hormonal changes that your body experiences during menopause. These can affect how your body processes dietary fats, triglycerides, and cholesterol. Heart attack and stroke are both medical emergencies. There are many things that you can do to help prevent heart disease and stroke:  Have your blood pressure checked at least every 1-2 years. High blood pressure causes heart disease and increases the risk of stroke.  If you are 47-49 years old, ask your health care provider if you should take aspirin to prevent a heart attack or a stroke.  Do not use any tobacco products, including cigarettes, chewing tobacco, or electronic cigarettes. If you need help quitting, ask your health care provider.  It is important to eat a healthy diet and maintain a healthy weight. ? Be sure to include plenty of vegetables, fruits, low-fat dairy products, and lean protein. ? Avoid eating foods that are high in solid fats, added sugars, or salt (sodium).  Get regular exercise. This is one of the most important things that you can do for your health. ? Try to exercise for at least 150 minutes each week. The type of exercise that you do should increase your heart rate and make you sweat. This is known as moderate-intensity exercise. ? Try to do strengthening exercises at least twice each week. Do these in addition to the moderate-intensity exercise.  Know your numbers.Ask your health care provider  to check your cholesterol and your blood glucose. Continue to have your blood tested as directed by your health care provider.  What should I know about cancer screening? There are several types of cancer. Take the following steps to reduce your risk and to catch  any cancer development as early as possible. Breast Cancer  Practice breast self-awareness. ? This means understanding how your breasts normally appear and feel. ? It also means doing regular breast self-exams. Let your health care provider know about any changes, no matter how small.  If you are 56 or older, have a clinician do a breast exam (clinical breast exam or CBE) every year. Depending on your age, family history, and medical history, it may be recommended that you also have a yearly breast X-ray (mammogram).  If you have a family history of breast cancer, talk with your health care provider about genetic screening.  If you are at high risk for breast cancer, talk with your health care provider about having an MRI and a mammogram every year.  Breast cancer (BRCA) gene test is recommended for women who have family members with BRCA-related cancers. Results of the assessment will determine the need for genetic counseling and BRCA1 and for BRCA2 testing. BRCA-related cancers include these types: ? Breast. This occurs in males or females. ? Ovarian. ? Tubal. This may also be called fallopian tube cancer. ? Cancer of the abdominal or pelvic lining (peritoneal cancer). ? Prostate. ? Pancreatic.  Cervical, Uterine, and Ovarian Cancer Your health care provider may recommend that you be screened regularly for cancer of the pelvic organs. These include your ovaries, uterus, and vagina. This screening involves a pelvic exam, which includes checking for microscopic changes to the surface of your cervix (Pap test).  For women ages 21-65, health care providers may recommend a pelvic exam and a Pap test every three years. For women ages 59-65, they may recommend the Pap test and pelvic exam, combined with testing for human papilloma virus (HPV), every five years. Some types of HPV increase your risk of cervical cancer. Testing for HPV may also be done on women of any age who have unclear Pap test  results.  Other health care providers may not recommend any screening for nonpregnant women who are considered low risk for pelvic cancer and have no symptoms. Ask your health care provider if a screening pelvic exam is right for you.  If you have had past treatment for cervical cancer or a condition that could lead to cancer, you need Pap tests and screening for cancer for at least 20 years after your treatment. If Pap tests have been discontinued for you, your risk factors (such as having a new sexual partner) need to be reassessed to determine if you should start having screenings again. Some women have medical problems that increase the chance of getting cervical cancer. In these cases, your health care provider may recommend that you have screening and Pap tests more often.  If you have a family history of uterine cancer or ovarian cancer, talk with your health care provider about genetic screening.  If you have vaginal bleeding after reaching menopause, tell your health care provider.  There are currently no reliable tests available to screen for ovarian cancer.  Lung Cancer Lung cancer screening is recommended for adults 64-84 years old who are at high risk for lung cancer because of a history of smoking. A yearly low-dose CT scan of the lungs is recommended if you:  Currently  smoke.  Have a history of at least 30 pack-years of smoking and you currently smoke or have quit within the past 15 years. A pack-year is smoking an average of one pack of cigarettes per day for one year.  Yearly screening should:  Continue until it has been 15 years since you quit.  Stop if you develop a health problem that would prevent you from having lung cancer treatment.  Colorectal Cancer  This type of cancer can be detected and can often be prevented.  Routine colorectal cancer screening usually begins at age 50 and continues through age 75.  If you have risk factors for colon cancer, your health  care provider may recommend that you be screened at an earlier age.  If you have a family history of colorectal cancer, talk with your health care provider about genetic screening.  Your health care provider may also recommend using home test kits to check for hidden blood in your stool.  A small camera at the end of a tube can be used to examine your colon directly (sigmoidoscopy or colonoscopy). This is done to check for the earliest forms of colorectal cancer.  Direct examination of the colon should be repeated every 5-10 years until age 75. However, if early forms of precancerous polyps or small growths are found or if you have a family history or genetic risk for colorectal cancer, you may need to be screened more often.  Skin Cancer  Check your skin from head to toe regularly.  Monitor any moles. Be sure to tell your health care provider: ? About any new moles or changes in moles, especially if there is a change in a mole's shape or color. ? If you have a mole that is larger than the size of a pencil eraser.  If any of your family members has a history of skin cancer, especially at a young age, talk with your health care provider about genetic screening.  Always use sunscreen. Apply sunscreen liberally and repeatedly throughout the day.  Whenever you are outside, protect yourself by wearing long sleeves, pants, a wide-brimmed hat, and sunglasses.  What should I know about osteoporosis? Osteoporosis is a condition in which bone destruction happens more quickly than new bone creation. After menopause, you may be at an increased risk for osteoporosis. To help prevent osteoporosis or the bone fractures that can happen because of osteoporosis, the following is recommended:  If you are 19-50 years old, get at least 1,000 mg of calcium and at least 600 mg of vitamin D per day.  If you are older than age 50 but younger than age 70, get at least 1,200 mg of calcium and at least 600 mg of  vitamin D per day.  If you are older than age 70, get at least 1,200 mg of calcium and at least 800 mg of vitamin D per day.  Smoking and excessive alcohol intake increase the risk of osteoporosis. Eat foods that are rich in calcium and vitamin D, and do weight-bearing exercises several times each week as directed by your health care provider. What should I know about how menopause affects my mental health? Depression may occur at any age, but it is more common as you become older. Common symptoms of depression include:  Low or sad mood.  Changes in sleep patterns.  Changes in appetite or eating patterns.  Feeling an overall lack of motivation or enjoyment of activities that you previously enjoyed.  Frequent crying spells.  Talk   with your health care provider if you think that you are experiencing depression. What should I know about immunizations? It is important that you get and maintain your immunizations. These include:  Tetanus, diphtheria, and pertussis (Tdap) booster vaccine.  Influenza every year before the flu season begins.  Pneumonia vaccine.  Shingles vaccine.  Your health care provider may also recommend other immunizations. This information is not intended to replace advice given to you by your health care provider. Make sure you discuss any questions you have with your health care provider. Document Released: 08/11/2005 Document Revised: 01/07/2016 Document Reviewed: 03/23/2015 Elsevier Interactive Patient Education  2018 [employer].

## 2017-07-12 ENCOUNTER — Encounter: Payer: Self-pay | Admitting: Family Medicine

## 2017-09-17 ENCOUNTER — Encounter: Payer: Self-pay | Admitting: Family Medicine

## 2017-09-17 ENCOUNTER — Ambulatory Visit: Payer: BLUE CROSS/BLUE SHIELD | Admitting: Family Medicine

## 2017-09-17 VITALS — BP 120/72 | HR 93 | Temp 98.4°F | Resp 12 | Ht 63.45 in | Wt 119.0 lb

## 2017-09-17 DIAGNOSIS — R110 Nausea: Secondary | ICD-10-CM

## 2017-09-17 DIAGNOSIS — S0990XD Unspecified injury of head, subsequent encounter: Secondary | ICD-10-CM

## 2017-09-17 DIAGNOSIS — R197 Diarrhea, unspecified: Secondary | ICD-10-CM

## 2017-09-17 LAB — CBC WITH DIFFERENTIAL/PLATELET
Basophils Absolute: 0 10*3/uL (ref 0.0–0.1)
Basophils Relative: 0.2 % (ref 0.0–3.0)
EOS ABS: 0 10*3/uL (ref 0.0–0.7)
EOS PCT: 0.7 % (ref 0.0–5.0)
HCT: 40.8 % (ref 36.0–46.0)
HEMOGLOBIN: 13.6 g/dL (ref 12.0–15.0)
Lymphocytes Relative: 23.2 % (ref 12.0–46.0)
Lymphs Abs: 1 10*3/uL (ref 0.7–4.0)
MCHC: 33.4 g/dL (ref 30.0–36.0)
MCV: 84.9 fl (ref 78.0–100.0)
MONO ABS: 0.6 10*3/uL (ref 0.1–1.0)
Monocytes Relative: 14.1 % — ABNORMAL HIGH (ref 3.0–12.0)
NEUTROS PCT: 61.8 % (ref 43.0–77.0)
Neutro Abs: 2.7 10*3/uL (ref 1.4–7.7)
Platelets: 199 10*3/uL (ref 150.0–400.0)
RBC: 4.81 Mil/uL (ref 3.87–5.11)
RDW: 13.9 % (ref 11.5–15.5)
WBC: 4.3 10*3/uL (ref 4.0–10.5)

## 2017-09-17 LAB — C-REACTIVE PROTEIN: CRP: 0.6 mg/dL (ref 0.5–20.0)

## 2017-09-17 LAB — COMPREHENSIVE METABOLIC PANEL
ALBUMIN: 4.3 g/dL (ref 3.5–5.2)
ALK PHOS: 56 U/L (ref 39–117)
ALT: 17 U/L (ref 0–35)
AST: 14 U/L (ref 0–37)
BILIRUBIN TOTAL: 0.4 mg/dL (ref 0.2–1.2)
BUN: 12 mg/dL (ref 6–23)
CO2: 29 mEq/L (ref 19–32)
Calcium: 10 mg/dL (ref 8.4–10.5)
Chloride: 103 mEq/L (ref 96–112)
Creatinine, Ser: 0.62 mg/dL (ref 0.40–1.20)
GFR: 106.08 mL/min (ref >60.00)
GLUCOSE: 82 mg/dL (ref 70–99)
Potassium: 3.9 mEq/L (ref 3.5–5.1)
SODIUM: 142 meq/L (ref 135–145)
TOTAL PROTEIN: 6.7 g/dL (ref 6.0–8.3)

## 2017-09-17 MED ORDER — ONDANSETRON HCL 4 MG PO TABS: 4.0000 mg | ORAL_TABLET | Freq: Three times a day (TID) | ORAL | 0 refills | Status: AC | PRN

## 2017-09-17 NOTE — Patient Instructions (Addendum)
  [name] I have seen you today for an acute visit.  A few things to remember from today's visit:   Acute diarrhea - Plan: C-reactive protein, Ova and parasite examination, Stool culture, CBC with Differential/Platelet, Comprehensive metabolic panel  Nausea without vomiting - Plan: ondansetron (ZOFRAN) 4 MG tablet  Traumatic injury of head, subsequent encounter   Medications prescribed today are intended for short period of time and will not be refill upon request, a follow up appointment might be necessary to discuss continuation of of treatment if appropriate.     Symptoms are most likely viral and self-limited. Good hand hygiene is important, decrease risk of transmission. Small and frequent sips of clear fluids, Pedialyte or Gatorade age good options.  BRAT diet: bananas, rice, applesauce, and toast.  If persistent vomiting stop solids for 24 hours then resume liquid/soft diet and advance as tolerated.   Notified immediately or seek medical attention if you cannot keep fluids down, decreased urine output, severe abdominal pain, red bright blood per rectum, or changes in mental status.  Follow if not full recovery in 2-3 weeks.        In general please monitor for signs of worsening symptoms and seek immediate medical attention if any concerning.    I hope you get better soon!

## 2017-09-17 NOTE — Progress Notes (Signed)
ACUTE VISIT   HPI:  Chief Complaint  Patient presents with  . Diarrhea    x 80 hours, just came back from the [facility]    [name] is a 57 y.o. female, who is here today complaining of 3-4 days of watery stools.  Symptoms started while she was overseas, [place] She has had several episodes of diarrhea per day, she is not sure about how many, every time she eats or drinks something. When asked about blood in the stool she states that "possibly" blood on tissue after defecation once, which she attributes to irritation from diarrhea. Abdominal cramps, intermittently, 7/10 but has been worse. Subjective fever, she has no noted chills.  According to patient, she was evaluated by physician at the resort. She took Imodium 2 days ago and yesterday.  She has had 2 stools today. Nausea with no vomiting. Abdominal cramps, diffuse, intermittent. Symptoms are aggravated by food intake, abdominal pain alleviated by defecation.  She came back from [place] last night. She was in a resort and some other people were sick with similar symptoms.  She is not sure if everybody ate something that was contaminated, no everybody ate same food as some people started with symptoms yesterday when they were leaving.  She denies urinary symptoms.  Her mother is having hip surgery in a few days, she is concerned about this being contagious.  She is also reporting "head concussion."  According to patient, the night before GI symptoms started she accidentally run into a glass door in her room.  According to patient, she did not realize that the last dose was partially open and hit her head against a glass when she was coming inside her room.  She denies LOC, states that she fell slightly lightheaded.  She got up several times during the night with episode of diarrhea, next morning she was lightheaded and having some frontal headache. She wonders if this head  trauma could be related to any of her symptoms.  She is still feeling "woozy", having frontal headache, dull, 2-3/10.  She denies visual changes or focal deficit. Headache is intermittent.    Review of Systems  Constitutional: Positive for activity change, appetite change, chills, fatigue and fever.  HENT: Negative for congestion, mouth sores, rhinorrhea, sore throat and trouble swallowing.   Respiratory: Negative for cough, shortness of breath and wheezing.   Cardiovascular: Negative for chest pain and palpitations.  Gastrointestinal: Positive for abdominal pain, diarrhea and nausea. Negative for blood in stool and vomiting.  Endocrine: Negative for cold intolerance and heat intolerance.  Genitourinary: Negative for decreased urine volume, dysuria and hematuria.  Musculoskeletal: Positive for back pain. Negative for arthralgias, gait problem, myalgias and neck stiffness.  Skin: Negative for rash.  Allergic/Immunologic: Negative for food allergies.  Neurological: Positive for light-headedness and headaches. Negative for syncope, weakness and numbness.  Hematological: Negative for adenopathy. Does not bruise/bleed easily.  Psychiatric/Behavioral: Positive for sleep disturbance. Negative for confusion. The patient is nervous/anxious.       Current Outpatient Medications on File Prior to Visit  Medication Sig Dispense Refill  . acetaminophen (TYLENOL) 500 MG tablet Take 500 mg by mouth every 6 (six) hours as needed.    . loratadine (CLARITIN) 10 MG tablet Take 10 mg by mouth daily.    . vitamin B-12 (CYANOCOBALAMIN) 1000 MCG tablet Take 2,500 mcg by mouth daily.     Current Facility-Administered Medications on File Prior to Visit  Medication Dose  Route Frequency Provider Last Rate Last Dose  . 0.9 %  sodium chloride infusion  500 mL Intravenous Continuous Gatha Mayer, MD         Past Medical History:  Diagnosis Date  . Alcohol abuse   . Anxiety   . Anxiety - followed [facility] 04/22/2012  . Arthritis    shoulder  . Chicken pox   . Depression   . Eating disorder   . GERD (gastroesophageal reflux disease)   . Headache(784.0)    frequent  . Hepatitis   . History of abnormal cervical Pap smear, s/p remote LEEP, normal paps since 04/22/2012  . History of pregnancy, G2P2 04/22/2012  . Hx of adenomatous polyp of colon 08/31/2016  . Hypertension    high blood pressure readings/white coat syndrome  . Jaundice   . Kidney stones   . Mental disorder   . Nevus    L face, eval by derm in the past, told cosmetic  . Shoulder arthritis - has seen [facility] 04/22/2012  . Urine incontinence    No Known Allergies  Social History   Socioeconomic History  . Marital status: Married    Spouse name: None  . Number of children: None  . Years of education: None  . Highest education level: None  Social Needs  . Financial resource strain: None  . Food insecurity - worry: None  . Food insecurity - inability: None  . Transportation needs - medical: None  . Transportation needs - non-medical: None  Occupational History  . None  Tobacco Use  . Smoking status: Never Smoker  . Smokeless tobacco: Never Used  Substance and Sexual Activity  . Alcohol use: Yes    Comment: per patient vodka Benard Halsted for four months  . Drug use: No  . Sexual activity: Yes  Other Topics Concern  . None  Social History Narrative  . None    Vitals:   09/17/17 0841  BP: 120/72  Pulse: 93  Resp: 12  Temp: 98.4 F (36.9 C)  SpO2: 99%   Body mass index is 20.78 kg/m.   Physical Exam  Nursing note and vitals reviewed. Constitutional: She is oriented to person, place, and time. She appears well-developed and well-nourished. She does not appear ill. No distress.  HENT:  Head: Normocephalic and atraumatic.  Mouth/Throat: Oropharynx is clear and moist. Mucous membranes are dry.  Eyes: Conjunctivae and EOM are normal. Pupils are equal, round, and reactive to light.  No scleral icterus.  Cardiovascular: Normal rate and regular rhythm.  No murmur heard. Respiratory: Effort normal and breath sounds normal. No respiratory distress.  GI: Soft. She exhibits no distension and no mass. Bowel sounds are increased. There is no hepatomegaly. There is no tenderness.  Musculoskeletal: She exhibits no edema.  Lymphadenopathy:    She has no cervical adenopathy.  Neurological: She is alert and oriented to person, place, and time. She has normal strength. No cranial nerve deficit. Gait normal.  Skin: Skin is warm. No rash noted. No erythema.  Psychiatric: Her mood appears anxious.  Well groomed, good eye contact.    ASSESSMENT AND PLAN:   Ms. Brittany Shepherd was seen today for diarrhea.  Diagnoses and all orders for this visit:  Lab Results  Component Value Date   WBC 4.3 09/17/2017   HGB 13.6 09/17/2017   HCT 40.8 09/17/2017   MCV 84.9 09/17/2017   PLT 199.0 09/17/2017   Lab Results  Component Value Date   ALT 17  09/17/2017   AST 14 09/17/2017   ALKPHOS 56 09/17/2017   BILITOT 0.4 09/17/2017   Lab Results  Component Value Date   CREATININE 0.62 09/17/2017   BUN 12 09/17/2017   NA 142 09/17/2017   K 3.9 09/17/2017   CL 103 09/17/2017   CO2 29 09/17/2017   Lab Results  Component Value Date   CRP 0.6 09/17/2017    Acute diarrhea  We discussed possible etiologies, including viral gastroenteritis, or other type of infectious disease, and parasites.  Symptomatic treatment recommended for now. Recommend adequate hydration, small frequent sips of clear fluids, Gatorade or other sports drink mixed with water are ideal. OTC Imodium could be continued but I do not recommend unless 6 or more stools daily.  Bland and light diet if tolerated. Adequate hand hygiene.  Clearly instructed about warning signs. Further recommendations will be given according to lab results. Follow-up with PCP in 7-10 days.   -     C-reactive protein -     Ova and parasite  examination; Future -     Stool culture; Future -     CBC with Differential/Platelet -     Comprehensive metabolic panel -     Stool culture -     Ova and parasite examination  Nausea without vomiting  Symptomatic treatment with Zofran 3 times daily as needed. Adequate oral hydration.  -     ondansetron (ZOFRAN) 4 MG tablet; Take 1 tablet (4 mg total) by mouth every 8 (eight) hours as needed for up to 5 days for nausea or vomiting.  Traumatic injury of head, subsequent encounter  We discussed head trauma vs head concussion.  Explained that some of her symptoms could be related to dehydration from the diarrhea, it is not clear to me she actually had a head concussion. Headache seems to be mild but given her concern about head injury, I offered head imaging but she feels like headache is getting better and is not at this time, I agree.  She will continue monitoring for warning signs.    Return in about 10 days (around 09/27/2017) for PCP.     -[name] was advised to seek immediate medical attention if sudden worsening symptoms.     Alaira Level G. Martinique, MD  [facility]. [place] office.

## 2017-09-20 ENCOUNTER — Telehealth: Payer: Self-pay | Admitting: Family Medicine

## 2017-09-20 NOTE — Telephone Encounter (Signed)
Copied from [place]. Topic: Inquiry >> Sep 20, 2017 12:54 PM Margot Ables wrote: Reason for CRM: pt states she is still having symptoms and is calling for stool culture results - pt is thinking she has a parasite. I informed pt of the mychart msg sent by Dr. Martinique. She said to please call her instead when results are back for the stool cultures.

## 2017-09-21 ENCOUNTER — Encounter: Payer: Self-pay | Admitting: Family Medicine

## 2017-09-21 NOTE — Telephone Encounter (Signed)
Left message informing patient that after results are looked at by Dr. Martinique and recommendations given, I would be calling her with results.

## 2017-09-22 LAB — STOOL CULTURE
MICRO NUMBER: 90338606
MICRO NUMBER: 90338607
MICRO NUMBER: 90338608
SHIGA RESULT: NOT DETECTED
SPECIMEN QUALITY: ADEQUATE
SPECIMEN QUALITY: ADEQUATE
SPECIMEN QUALITY:: ADEQUATE

## 2017-09-22 LAB — OVA AND PARASITE EXAMINATION
CONCENTRATE RESULT: NONE SEEN
TRICHROME RESULT: NONE SEEN

## 2017-09-22 LAB — TIQ-NTM

## 2017-09-23 ENCOUNTER — Encounter: Payer: Self-pay | Admitting: Family Medicine

## 2017-09-24 ENCOUNTER — Telehealth: Payer: Self-pay | Admitting: *Deleted

## 2017-09-24 NOTE — Telephone Encounter (Signed)
Copied from [place] [id number]. Topic: General - Other >> Sep 24, 2017 12:17 PM Marin Olp L wrote: Reason for CRM: Patient would like follow up information now that her stool sample and other tests are back. She prefers a call back from Dr. Noel Journey cma.

## 2017-09-25 ENCOUNTER — Encounter: Payer: Self-pay | Admitting: Family Medicine

## 2017-09-25 NOTE — Telephone Encounter (Signed)
Patient called back and I informed her Dr Martinique sent a Mychart message with the results on 3/24.  Patient questioned what to do at this point and after reviewing the office notes from Dr Martinique, it was recommended she schedule a follow up with Dr Maudie Mercury in 7-10 days.  Patient stated she will try [facility] and call back for an appt if needed.

## 2017-09-25 NOTE — Telephone Encounter (Signed)
I left a message for the pt to return my call. 

## 2017-10-12 ENCOUNTER — Encounter: Payer: Self-pay | Admitting: Family Medicine

## 2018-05-14 ENCOUNTER — Encounter: Payer: Self-pay | Admitting: Family Medicine

## 2018-07-31 ENCOUNTER — Encounter: Payer: Self-pay | Admitting: Family Medicine

## 2018-07-31 NOTE — Progress Notes (Signed)
HPI:  Using dictation device. Unfortunately this device frequently misinterprets words/phrases.  Here for CPE: Due for mammogram, pap, flu vaccine, labs  -Concerns and/or follow up today:  PMH B12 deficiency and hyperlipidemia. 7 years free from alcohol abuse. Eating healthy. Active. -Diabetes and Dyslipidemia Screening: due for lab -Vaccines: see vaccine section EPIC -pap history: 08/2014 normal with negl hpv  - declined today and prefers 5year screening -FDLMP: see nursing notes -sexual activity: not discussed -wants STI testing (Hep C if born 41-65): no -FH breast, colon or ovarian ca: see FH Last mammogram: due - wants [place] to order as she forgets to schedule Last colon cancer screening: colonoscopy 08/2016, 5 year repeat advised Breast Ca Risk Assessment: see family history and pt history DEXA (>/= 65): n/a -Alcohol, Tobacco, drug use: see social history  Review of Systems - no reported  fevers, unintentional weight loss, vision loss, hearing loss, chest pain, sob, hemoptysis, melena, hematochezia, hematuria, genital discharge, changing or concerning skin lesions, bleeding, bruising, loc, thoughts of self harm or SI  Past Medical History:  Diagnosis Date  . Alcohol abuse   . Anxiety - followed [facility] 04/22/2012  . Arthritis    shoulder  . Chicken pox   . Depression   . Eating disorder   . GERD (gastroesophageal reflux disease)   . Headache(784.0)    frequent  . Hepatitis   . History of abnormal cervical Pap smear, s/p remote LEEP, normal paps since 04/22/2012  . History of pregnancy, G2P2 04/22/2012  . Hx of adenomatous polyp of colon 08/31/2016  . Hypertension    high blood pressure readings/white coat syndrome  . Jaundice   . Kidney stones   . Nevus    L face, eval by derm in the past, told cosmetic  . Shoulder arthritis - has seen [facility] 04/22/2012  . Urine incontinence     Past Surgical History:  Procedure Laterality Date  . urethrea  stretched  age 58    Family History  Problem Relation Age of Onset  . Arthritis Other        paternal great grandfather  . Arthritis Other        parent  . Alcohol abuse Other        paternal and maternal uncle  . Prostate cancer Paternal Grandfather   . Hyperlipidemia Mother   . Heart disease Maternal Grandmother   . Hypertension Unknown   . Mental illness Unknown   . Diabetes Unknown   . ADD / ADHD Brother   . ADD / ADHD Son   . Colon cancer Other     Social History   Socioeconomic History  . Marital status: Married    Spouse name: Not on file  . Number of children: Not on file  . Years of education: Not on file  . Highest education level: Not on file  Occupational History  . Not on file  Social Needs  . Financial resource strain: Not on file  . Food insecurity:    Worry: Not on file    Inability: Not on file  . Transportation needs:    Medical: Not on file    Non-medical: Not on file  Tobacco Use  . Smoking status: Never Smoker  . Smokeless tobacco: Never Used  Substance and Sexual Activity  . Alcohol use: Yes    Comment: per patient vodka Benard Halsted for four months  . Drug use: No  . Sexual activity: Yes  Lifestyle  . Physical activity:  Days per week: Not on file    Minutes per session: Not on file  . Stress: Not on file  Relationships  . Social connections:    Talks on phone: Not on file    Gets together: Not on file    Attends religious service: Not on file    Active member of club or organization: Not on file    Attends meetings of clubs or organizations: Not on file    Relationship status: Not on file  Other Topics Concern  . Not on file  Social History Narrative  . Not on file     Current Outpatient Medications:  .  acetaminophen (TYLENOL) 500 MG tablet, Take 500 mg by mouth every 6 (six) hours as needed., Disp: , Rfl:  .  loratadine (CLARITIN) 10 MG tablet, Take 10 mg by mouth daily., Disp: , Rfl:  .  vitamin B-12 (CYANOCOBALAMIN) 1000  MCG tablet, Take 2,500 mcg by mouth daily., Disp: , Rfl:   Current Facility-Administered Medications:  .  0.9 %  sodium chloride infusion, 500 mL, Intravenous, Continuous, Carlean Purl Ofilia Neas, MD  EXAM:  There were no vitals filed for this visit.  GENERAL: vitals reviewed and listed below, alert, oriented, appears well hydrated and in no acute distress  HEENT: head atraumatic, PERRLA, normal appearance of eyes, ears, nose and mouth. moist mucus membranes.  NECK: supple, no masses or lymphadenopathy  LUNGS: clear to auscultation bilaterally, no rales, rhonchi or wheeze  CV: HRRR, no peripheral edema or cyanosis, normal pedal pulses  ABDOMEN: bowel sounds normal, soft, non tender to palpation, no masses, no rebound or guarding  GU/BREAST: declined, agrees to go to breast center for mammogram  SKIN: no rash or abnormal lesions, declined full skin exam  MS: normal gait, moves all extremities normally  NEURO: normal gait, speech and thought processing grossly intact, muscle tone grossly intact throughout  PSYCH: normal affect, pleasant and cooperative  ASSESSMENT AND PLAN:  Discussed the following assessment and plan:  PREVENTIVE EXAM: -Discussed and advised all [place] preventive services health task force level A and B recommendations for age, sex and risks. -Advised at least 150 minutes of exercise per week and a healthy diet with avoidance of (less then 1 serving per week) processed foods, white starches, red meat, fast foods and sweets and consisting of: * 5-9 servings of fresh fruits and vegetables (not corn or potatoes) *nuts and seeds, beans *olives and olive oil *lean meats such as fish and white chicken  *whole grains -labs, studies and vaccines per orders this encounter -past due on mammogram and advised to do asap, she agreed to allow [place] to order -discussed pap guidelines and she prefers to wait until next visit - Hemoglobin A1c  2. Hyperlipidemia, unspecified  hyperlipidemia type - Lipid panel  3. B12 deficiency - Vitamin B12   Patient advised to return to clinic immediately if symptoms worsen or persist or new concerns.  There are no Patient Instructions on file for this visit.  No follow-ups on file.  Lucretia Kern, DO

## 2018-08-01 ENCOUNTER — Ambulatory Visit (INDEPENDENT_AMBULATORY_CARE_PROVIDER_SITE_OTHER): Payer: BLUE CROSS/BLUE SHIELD | Admitting: Family Medicine

## 2018-08-01 ENCOUNTER — Encounter: Payer: Self-pay | Admitting: Family Medicine

## 2018-08-01 VITALS — BP 100/78 | HR 66 | Temp 97.9°F | Ht 62.25 in | Wt 126.5 lb

## 2018-08-01 DIAGNOSIS — Z1239 Encounter for other screening for malignant neoplasm of breast: Secondary | ICD-10-CM

## 2018-08-01 DIAGNOSIS — E538 Deficiency of other specified B group vitamins: Secondary | ICD-10-CM | POA: Diagnosis not present

## 2018-08-01 DIAGNOSIS — E785 Hyperlipidemia, unspecified: Secondary | ICD-10-CM | POA: Diagnosis not present

## 2018-08-01 DIAGNOSIS — Z0000 Encounter for general adult medical examination without abnormal findings: Secondary | ICD-10-CM | POA: Diagnosis not present

## 2018-08-01 DIAGNOSIS — Z23 Encounter for immunization: Secondary | ICD-10-CM

## 2018-08-01 LAB — VITAMIN B12: VITAMIN B 12: 340 pg/mL (ref 211–911)

## 2018-08-01 LAB — LIPID PANEL
CHOLESTEROL: 261 mg/dL — AB (ref 0–200)
HDL: 60.4 mg/dL (ref >39.00)
LDL Cholesterol: 166 mg/dL — ABNORMAL HIGH (ref 0–99)
NonHDL: 200.54
Total CHOL/HDL Ratio: 4
Triglycerides: 172 mg/dL — ABNORMAL HIGH (ref 0.0–149.0)
VLDL: 34.4 mg/dL (ref 0.0–40.0)

## 2018-08-01 LAB — HEMOGLOBIN A1C: Hgb A1c MFr Bld: 5.7 % (ref 4.6–6.5)

## 2018-08-01 NOTE — Patient Instructions (Signed)
BEFORE YOU LEAVE: -order mammogram at GI -flu vaccine -labs -follow up: yearly for physical and as needed   Preventive Care 40-64 Years, Female Preventive care refers to lifestyle choices and visits with your health care provider that can promote health and wellness. What does preventive care include?   A yearly physical exam. This is also called an annual well check.  Dental exams once or twice a year.  Routine eye exams. Ask your health care provider how often you should have your eyes checked.  Personal lifestyle choices, including: ? Daily care of your teeth and gums. ? Regular physical activity. ? Eating a healthy diet. ? Avoiding tobacco and drug use. ? Limiting alcohol use. ? Practicing safe sex. ? Taking vitamin and mineral supplements as recommended by your health care provider. What happens during an annual well check? The services and screenings done by your health care provider during your annual well check will depend on your age, overall health, lifestyle risk factors, and family history of disease. Counseling Your health care provider may ask you questions about your:  Alcohol use.  Tobacco use.  Drug use.  Emotional well-being.  Home and relationship well-being.  Sexual activity.  Eating habits.  Work and work Statistician.  Method of birth control.  Menstrual cycle.  Pregnancy history. Screening You may have the following tests or measurements:  Height, weight, and BMI.  Blood pressure.  Lipid and cholesterol levels. These may be checked every 5 years, or more frequently if you are over 82 years old.  Skin check.  Lung cancer screening. You may have this screening every year starting at age 37 if you have a 30-pack-year history of smoking and currently smoke or have quit within the past 15 years.  Colorectal cancer screening. All adults should have this screening starting at age 55 and continuing until age 67. Your health care provider  may recommend screening at age 87. You will have tests every 1-10 years, depending on your results and the type of screening test. People at increased risk should start screening at an earlier age. Screening tests may include: ? Guaiac-based fecal occult blood testing. ? Fecal immunochemical test (FIT). ? Stool DNA test. ? Virtual colonoscopy. ? Sigmoidoscopy. During this test, a flexible tube with a tiny camera (sigmoidoscope) is used to examine your rectum and lower colon. The sigmoidoscope is inserted through your anus into your rectum and lower colon. ? Colonoscopy. During this test, a long, thin, flexible tube with a tiny camera (colonoscope) is used to examine your entire colon and rectum.  Hepatitis C blood test.  Hepatitis B blood test.  Sexually transmitted disease (STD) testing.  Diabetes screening. This is done by checking your blood sugar (glucose) after you have not eaten for a while (fasting). You may have this done every 1-3 years.  Mammogram. This may be done every 1-2 years. Talk to your health care provider about when you should start having regular mammograms. This may depend on whether you have a family history of breast cancer.  BRCA-related cancer screening. This may be done if you have a family history of breast, ovarian, tubal, or peritoneal cancers.  Pelvic exam and Pap test. This may be done every 3 years starting at age 65. Starting at age 15, this may be done every 5 years if you have a Pap test in combination with an HPV test.  Bone density scan. This is done to screen for osteoporosis. You may have this scan if you are  at high risk for osteoporosis. Discuss your test results, treatment options, and if necessary, the need for more tests with your health care provider. Vaccines Your health care provider may recommend certain vaccines, such as:  Influenza vaccine. This is recommended every year.  Tetanus, diphtheria, and acellular pertussis (Tdap, Td) vaccine.  You may need a Td booster every 10 years.  Varicella vaccine. You may need this if you have not been vaccinated.  Zoster vaccine. You may need this after age 44.  Measles, mumps, and rubella (MMR) vaccine. You may need at least one dose of MMR if you were born in 1957 or later. You may also need a second dose.  Pneumococcal 13-valent conjugate (PCV13) vaccine. You may need this if you have certain conditions and were not previously vaccinated.  Pneumococcal polysaccharide (PPSV23) vaccine. You may need one or two doses if you smoke cigarettes or if you have certain conditions.  Meningococcal vaccine. You may need this if you have certain conditions.  Hepatitis A vaccine. You may need this if you have certain conditions or if you travel or work in places where you may be exposed to hepatitis A.  Hepatitis B vaccine. You may need this if you have certain conditions or if you travel or work in places where you may be exposed to hepatitis B.  Haemophilus influenzae type b (Hib) vaccine. You may need this if you have certain conditions. Talk to your health care provider about which screenings and vaccines you need and how often you need them. This information is not intended to replace advice given to you by your health care provider. Make sure you discuss any questions you have with your health care provider. Document Released: 07/16/2015 Document Revised: 08/09/2017 Document Reviewed: 04/20/2015 Elsevier Interactive Patient Education  2019 [employer].

## 2018-08-01 NOTE — Addendum Note (Signed)
Addended by: Agnes Lawrence on: 08/01/2018 10:03 AM   Modules accepted: Orders

## 2018-08-07 ENCOUNTER — Ambulatory Visit
Admission: RE | Admit: 2018-08-07 | Discharge: 2018-08-07 | Disposition: A | Discharge status: Home / Self Care | Payer: BLUE CROSS/BLUE SHIELD | Source: Ambulatory Visit | Attending: Family Medicine | Admitting: Family Medicine

## 2018-08-07 DIAGNOSIS — Z1239 Encounter for other screening for malignant neoplasm of breast: Secondary | ICD-10-CM

## 2018-08-08 ENCOUNTER — Telehealth: Payer: Self-pay | Admitting: *Deleted

## 2018-08-08 ENCOUNTER — Other Ambulatory Visit: Payer: Self-pay | Admitting: Family Medicine

## 2018-08-08 DIAGNOSIS — R928 Other abnormal and inconclusive findings on diagnostic imaging of breast: Secondary | ICD-10-CM

## 2018-08-08 NOTE — Telephone Encounter (Signed)
Spoke with pt

## 2018-08-08 NOTE — Telephone Encounter (Signed)
Patient calling and states that she received a return call from Dr Maudie Mercury, but did not have good phone service and the call dropped. Patient returning call. Please advise.

## 2018-08-08 NOTE — Telephone Encounter (Signed)
Copied from [place] [id number]. Topic: General - Other >> Aug 08, 2018 10:54 AM Lennox Solders wrote: Reason for CRM: pt is calling and needs reassurance from dr kim. Pt had abnormal mammogram and will have additional imaging done on 08-13-2018. Pt is requesting dr Maudie Mercury to return her call

## 2018-08-13 ENCOUNTER — Ambulatory Visit
Admission: RE | Admit: 2018-08-13 | Discharge: 2018-08-13 | Disposition: A | Discharge status: Home / Self Care | Payer: BLUE CROSS/BLUE SHIELD | Source: Ambulatory Visit | Attending: Family Medicine | Admitting: Family Medicine

## 2018-08-13 ENCOUNTER — Ambulatory Visit: Payer: BLUE CROSS/BLUE SHIELD

## 2018-08-13 ENCOUNTER — Other Ambulatory Visit: Payer: BLUE CROSS/BLUE SHIELD

## 2018-08-13 DIAGNOSIS — R928 Other abnormal and inconclusive findings on diagnostic imaging of breast: Secondary | ICD-10-CM

## 2018-09-10 ENCOUNTER — Encounter: Payer: Self-pay | Admitting: Family Medicine

## 2018-09-10 ENCOUNTER — Ambulatory Visit (INDEPENDENT_AMBULATORY_CARE_PROVIDER_SITE_OTHER): Payer: 59 | Admitting: Family Medicine

## 2018-09-10 VITALS — BP 100/70 | HR 79 | Temp 98.1°F | Ht 62.25 in | Wt 130.4 lb

## 2018-09-10 DIAGNOSIS — M545 Low back pain, unspecified: Secondary | ICD-10-CM

## 2018-09-10 LAB — POCT URINALYSIS DIPSTICK
Bilirubin, UA: NEGATIVE
GLUCOSE UA: NEGATIVE
KETONES UA: NEGATIVE
Leukocytes, UA: NEGATIVE
Nitrite, UA: NEGATIVE
Protein, UA: NEGATIVE
Spec Grav, UA: 1.02 (ref 1.010–1.025)
Urobilinogen, UA: 0.2 E.U./dL
pH, UA: 7.5 (ref 5.0–8.0)

## 2018-09-10 LAB — URINALYSIS, MICROSCOPIC ONLY: RBC / HPF: NONE SEEN (ref 0–?)

## 2018-09-10 MED ORDER — TIZANIDINE HCL 2 MG PO TABS: 2.0000 mg | ORAL_TABLET | Freq: Two times a day (BID) | ORAL | 0 refills | Status: DC | PRN

## 2018-09-10 NOTE — Progress Notes (Signed)
HPI:  Using dictation device. Unfortunately this device frequently misinterprets words/phrases.  Acute visit for low back pain: -started yesterday after a fairly strenuous walk up hill -pain is dull today, sharp at times and was severe last night, improved today in the L low back and L buttock mostly -no fevers, malaise, NVD, constipation, chills, hematuria, weakness, numbness, radiation past the buttock -active at baseline with yoga and exercise -she is concerned about a kidney stone as > 10 years ago had blood in urine and saw a urologist - had neg CT without stones, but was told she may be more likely to form stones because of her anatomy   ROS: See pertinent positives and negatives per HPI.  Past Medical History:  Diagnosis Date  . Alcohol abuse   . Anxiety - [facility]/2012  . Arthritis    shoulder  . Chicken pox   . Depression   . Eating disorder   . GERD (gastroesophageal reflux disease)   . Headache(784.0)    frequent  . Hepatitis   . History of abnormal cervical Pap smear, s/p remote LEEP, normal paps since 04/22/2012  . History of pregnancy, G2P2 04/22/2012  . Hx of adenomatous polyp of colon 08/31/2016  . Hypertension    high blood pressure readings/white coat syndrome  . Jaundice   . Kidney stones   . Nevus    L face, eval by derm in the past, told cosmetic  . Shoulder arthritis - has seen [facility] 04/22/2012  . Urine incontinence     Past Surgical History:  Procedure Laterality Date  . urethrea stretched  age 75    Family History  Problem Relation Age of Onset  . Arthritis Other        paternal great grandfather  . Arthritis Other        parent  . Alcohol abuse Other        paternal and maternal uncle  . Prostate cancer Paternal Grandfather   . Hyperlipidemia Mother   . Heart disease Maternal Grandmother   . Hypertension Other   . Mental illness Other   . Diabetes Other   . ADD / ADHD Brother   . ADD / ADHD Son    . Colon cancer Other     SOCIAL HX: see hpi   Current Outpatient Medications:  .  acetaminophen (TYLENOL) 500 MG tablet, Take 500 mg by mouth every 6 (six) hours as needed., Disp: , Rfl:  .  loratadine (CLARITIN) 10 MG tablet, Take 10 mg by mouth daily., Disp: , Rfl:  .  vitamin B-12 (CYANOCOBALAMIN) 1000 MCG tablet, Take 2,500 mcg by mouth daily., Disp: , Rfl:  .  tiZANidine (ZANAFLEX) 2 MG tablet, Take 1 tablet (2 mg total) by mouth every 12 (twelve) hours as needed for muscle spasms., Disp: 20 tablet, Rfl: 0  Current Facility-Administered Medications:  .  0.9 %  sodium chloride infusion, 500 mL, Intravenous, Continuous, Gatha Mayer, MD  EXAM:  Vitals:   09/10/18 1340  BP: 100/70  Pulse: 79  Temp: 98.1 F (36.7 C)    Body mass index is 23.66 kg/m.  GENERAL: vitals reviewed and listed above, alert, oriented, appears well hydrated and in no acute distress  HEENT: atraumatic, conjunttiva clear, no obvious abnormalities on inspection of external nose and ears  NECK: no obvious masses on inspection  LUNGS: clear to auscultation bilaterally, no wheezes, rales or rhonchi, good air movement  CV: HRRR, no peripheral edema  MS:  moves all extremities without noticeable abnormality, TTP in the lower lumbar paraspinal tissues, mild and the L SI jt area, normal strengths, stt, function of LEs bilaterally, neg SLRT, CLRT, some pain with FADIR, neg FABER  ABD: BS+, soft, NTTP, no CVA TTP  PSYCH: pleasant and cooperative, no obvious depression or anxiety  ASSESSMENT AND PLAN:  Discussed the following assessment and plan:  Low back pain, unspecified back pain laterality, unspecified chronicity, unspecified whether sciatica present - Plan: POC Urinalysis Dipstick, Urine Microscopic Only  -we discussed possible serious and likely etiologies, workup and treatment, treatment risks and return precautions; most of our dips here have blood - will get micro to eval further and CT  stone study if blood present. However, symptoms and exam suggest musculoskeletal etiology more likely. Discussed other potential etiologies as well. -after this discussion, Isabell opted for further urine study, CT stone study if blood in urine. tx conservatively for possible musculoskeletal etiology in interim with plenty of water. -advised follow up advised close follow up in 2 weeks, sooner if worsening, not continuing to improve or other symptoms arise.  Patient Instructions  BEFORE YOU LEAVE: -urine micro -follow up: 2 weeks  Heat  Tylenol or aleve if needed for pain per instructions on bottle  Gentle stretching  Can try the muscle relaxer  I hope you are feeling better soon! Seek care promptly if your symptoms worsen, new concerns arise or you are not improving with treatment.      Lucretia Kern, DO

## 2018-09-10 NOTE — Patient Instructions (Signed)
BEFORE YOU LEAVE: -urine micro -follow up: 2 weeks  Heat  Tylenol or aleve if needed for pain per instructions on bottle  Gentle stretching  Can try the muscle relaxer  I hope you are feeling better soon! Seek care promptly if your symptoms worsen, new concerns arise or you are not improving with treatment.

## 2018-09-11 ENCOUNTER — Telehealth: Payer: Self-pay | Admitting: Family Medicine

## 2018-09-11 NOTE — Telephone Encounter (Signed)
Only lab I see is urine micro which was ok.   It is possible that pain was related to stone and glad to hear that she is feeling better!  As long as pain stays away, I do not think further eval should be needed. She can d/c the muscle relaxer and let Dr. Maudie Mercury know if anything recurs!

## 2018-09-11 NOTE — Telephone Encounter (Signed)
I called the pt and informed her of the message below

## 2018-09-11 NOTE — Telephone Encounter (Signed)
Copied from [place] [id number]. Topic: Quick Communication - See Telephone Encounter >> Sep 11, 2018  8:53 AM Bea Graff, NT wrote: CRM for notification. See Telephone encounter for: 09/11/18. Pt has a follow-up to her appt she had yesterday regarding back pain. She states she took one muscle relaxer as prescribed and the medication made her sleepy but did not help with the pain. Also, she used a heating pad at bedtime that she states she is not sure if it helped. During the night she had familiar pain and pressure as she had in the past with kidney stones. Her back is feeling a lot better this morning. She states she believes she may have passed a kidney stone last night and wanted to give Dr. Maudie Mercury an update. She is requesting her lab results once resulted as well.

## 2018-09-17 ENCOUNTER — Ambulatory Visit: Payer: Self-pay | Admitting: Family Medicine

## 2018-09-17 NOTE — Telephone Encounter (Signed)
   Reason for Disposition . General information question, no triage required and triager able to answer question  Answer Assessment - Initial Assessment Questions 1. REASON FOR CALL or QUESTION: "What is your reason for calling today?" or "How can I best help you?" or "What question do you have that I can help answer?"     Pt.calling for advise on corona virus exposure. States she was in [facility] March 5-9  "where there are confirmed cases." Has not been in contact with anyone who has tested positive for COVID 19. Does not have any symptoms - no fever,cough or shortness of breath. Has a "stuffy nose." Temp. Is 98.4. Reassured pt. And she will self- monitor for any symptoms. Encouraged to call back if she develops any symptoms. Verbalizes understanding.  Protocols used: INFORMATION ONLY CALL-A-AH

## 2018-09-19 ENCOUNTER — Telehealth: Payer: Self-pay | Admitting: Family Medicine

## 2018-09-19 NOTE — Telephone Encounter (Signed)
Copied from [place] [id number]. Topic: Quick Communication - See Telephone Encounter >> Sep 19, 2018 12:39 PM Bea Graff, NT wrote: CRM for notification. See Telephone encounter for: 09/19/18. Pt calling back regarding being in [facility] and is unsure if she came in contact with someone who has tested positive for COVID-19. Pt has been self quarantined herself since 09/17/2018 when previously spoke with triage. Pt is adamant to be tested for the coronavirus. Pt is not showing any signs or symptoms, temp is 98.4. Advised pt per nurse triage that at this time to self quarantine and if symptoms arise call back to be reevaluated. Also advised pt we would make her PCP aware.

## 2018-09-20 ENCOUNTER — Encounter: Payer: Self-pay | Admitting: Family Medicine

## 2018-09-20 NOTE — Telephone Encounter (Signed)
I called the pt and informed her of the message below.  She agreed to call back if needed.

## 2018-09-20 NOTE — Telephone Encounter (Signed)
At this point without known exposure to contact and without cough, fever testing is not recommended. I agree with monitoring of symptoms and self quarentine. If worsening of symptoms or new symptoms let us know.

## 2019-03-13 ENCOUNTER — Other Ambulatory Visit: Payer: Self-pay

## 2019-03-13 ENCOUNTER — Telehealth (INDEPENDENT_AMBULATORY_CARE_PROVIDER_SITE_OTHER): Payer: 59 | Admitting: Family Medicine

## 2019-03-13 ENCOUNTER — Telehealth: Payer: 59

## 2019-03-13 ENCOUNTER — Encounter: Payer: Self-pay | Admitting: Family Medicine

## 2019-03-13 DIAGNOSIS — R0981 Nasal congestion: Secondary | ICD-10-CM | POA: Diagnosis not present

## 2019-03-13 DIAGNOSIS — B882 Other arthropod infestations: Secondary | ICD-10-CM | POA: Diagnosis not present

## 2019-03-13 DIAGNOSIS — J302 Other seasonal allergic rhinitis: Secondary | ICD-10-CM

## 2019-03-13 DIAGNOSIS — Z20822 Contact with and (suspected) exposure to covid-19: Secondary | ICD-10-CM

## 2019-03-13 DIAGNOSIS — R509 Fever, unspecified: Secondary | ICD-10-CM | POA: Diagnosis not present

## 2019-03-13 MED ORDER — DOXYCYCLINE HYCLATE 100 MG PO TABS: 100.0000 mg | ORAL_TABLET | Freq: Two times a day (BID) | ORAL | 0 refills | Status: DC

## 2019-03-13 NOTE — Progress Notes (Signed)
I have scheduled a virtual appointment with Dr. Maudie Mercury on Tuesday 03/18/2019 at 10:20

## 2019-03-13 NOTE — Patient Instructions (Signed)
Follow up:  In 5-7 days  -I sent the medication(s) we discussed to your pharmacy:  Meds ordered this encounter  Medications  . doxycycline (VIBRA-TABS) 100 MG tablet    Sig: Take 1 tablet (100 mg total) by mouth 2 (two) times daily.    Dispense:  28 tablet    Refill:  0   Please let us know if you have any questions or concerns regarding this prescription.  I hope you are feeling better soon! Seek care promptly if your symptoms worsen, new concerns arise or you are not improving with treatment.   Self Isolation/Home Quarantine: -see the CDC site for information:   RunningShows.co.za.html   -STAY HOME except for to seek medical care -stay in your own room away from others in your house and use a separate bathroom if possible -Wash hands frequently, wear a mask if you leave your room and interact as little as possible with others -seek medical care immediately if worsening - call our office for a visit or call ahead if going elsewhere to an urgent care  -seek emergency care if very sick or severe symptoms - call 911 -isolate for at least 10 days from the onset of symptoms PLUS 3 days of no fever PLUS 3 days of improving symptoms    Novel Coronavirus Testing:  Positive test. These tests are not 100% perfect, but if you tested positive for COVID-19, this confirms that you have contracted the SARS-CoV-2 virus. STAY HOME to complete full Quarantine per CDC guidelines.  Negative test. These tests are not 100% perfect but if you tested negative for COVID-19, this indicates that you may not have contracted the SARS-CoV-2 virus. Follow your doctor's recommendations and the CDC guidelines.

## 2019-03-13 NOTE — Progress Notes (Signed)
Virtual Visit via Video Note  I connected with Brittany Shepherd  on 03/13/19 at 11:40 AM EDT by a video enabled telemedicine application and verified that I am speaking with the correct person using two identifiers.  Location patient: home Location provider:work or home office Persons participating in the virtual visit: patient, provider  I discussed the limitations of evaluation and management by telemedicine and the availability of in person appointments. The patient expressed understanding and agreed to proceed.   HPI:  Acute visit for febrile illness -started yesterday -symptoms included sinus congestion, body aches, fever 101 yesterday, fatigue, nausea, headache, one loose stool -T today 100.1 -denies SOB, much of a cough, vomiting, loss of taste or smell, dysuria -picked a tiny dark object off 1.5 weeks ago and she wonders if it was a deer tick - she could not find it after picking it off, unsure how long it was attached - left itchy bump -her husband had to get tested for COVID due to an exposure a little while back; Brittany Shepherd did COVID test yesterday - she did go to an outdoor church service on 9/6 and did hug the pastor - another member of the group there that day was diagnosed in the last week with coronavirus, nobody had masks on and they were not really socially distancing -her sister and brother and law recently visited as well from [place]  ROS: See pertinent positives and negatives per HPI.  Past Medical History:  Diagnosis Date  . Alcohol abuse   . Anxiety - [facility]  . Arthritis    shoulder  . Chicken pox   . Depression   . Eating disorder   . GERD (gastroesophageal reflux disease)   . Headache(784.0)    frequent  . Hepatitis   . History of abnormal cervical Pap smear, s/p remote LEEP, normal paps since 04/22/2012  . History of pregnancy, G2P2 04/22/2012  . Hx of adenomatous polyp of colon 08/31/2016  . Hypertension    high blood pressure  readings/white coat syndrome  . Jaundice   . Kidney stones   . Nevus    L face, eval by derm in the past, told cosmetic  . Shoulder arthritis - has seen [facility] 04/22/2012  . Urine incontinence     Past Surgical History:  Procedure Laterality Date  . urethrea stretched  age 42    Family History  Problem Relation Age of Onset  . Arthritis Other        paternal great grandfather  . Arthritis Other        parent  . Alcohol abuse Other        paternal and maternal uncle  . Prostate cancer Paternal Grandfather   . Hyperlipidemia Mother   . Heart disease Maternal Grandmother   . Hypertension Other   . Mental illness Other   . Diabetes Other   . ADD / ADHD Brother   . ADD / ADHD Son   . Colon cancer Other     SOCIAL HX: see hpi   Current Outpatient Medications:  .  acetaminophen (TYLENOL) 500 MG tablet, Take 500 mg by mouth every 6 (six) hours as needed., Disp: , Rfl:  .  CALCIUM PO, Take by mouth., Disp: , Rfl:  .  Ergocalciferol (VITAMIN D2 PO), Take by mouth., Disp: , Rfl:  .  loratadine (CLARITIN) 10 MG tablet, Take 10 mg by mouth daily as needed. , Disp: , Rfl:  .  Multiple Vitamins-Minerals (ZINC PO), Take  by mouth., Disp: , Rfl:  .  SELENIUM PO, Take by mouth., Disp: , Rfl:  .  TURMERIC PO, Take by mouth., Disp: , Rfl:  .  vitamin B-12 (CYANOCOBALAMIN) 1000 MCG tablet, Take 2,500 mcg by mouth daily., Disp: , Rfl:  .  doxycycline (VIBRA-TABS) 100 MG tablet, Take 1 tablet (100 mg total) by mouth 2 (two) times daily., Disp: 28 tablet, Rfl: 0  Current Facility-Administered Medications:  .  0.9 %  sodium chloride infusion, 500 mL, Intravenous, Continuous, Carlean Purl, Ofilia Neas, MD  EXAM:  VITALS per patient if applicable: T 99991111  GENERAL: alert, oriented, appears well and in no acute distress  HEENT: atraumatic, conjunttiva clear, no obvious abnormalities on inspection of external nose and ears  NECK: normal movements of the head and neck  LUNGS: on  inspection no signs of respiratory distress, breathing rate appears normal, no obvious gross SOB, gasping or wheezing  CV: no obvious cyanosis   SKIN: small erythematous papule in antecubital elbow where she had the possible tick bite  MS: moves all visible extremities without noticeable abnormality  PSYCH/NEURO: pleasant and cooperative, no obvious depression or anxiety, speech and thought processing grossly intact  ASSESSMENT AND PLAN:  Discussed the following assessment and plan:  No diagnosis found.  -we discussed possible serious and likely etiologies, options for evaluation and workup, limitations of telemedicine visit vs in person visit, treatment, treatment risks and precautions. Discussed possibility of COVID and advised home isolation regardless of test results per CDC. Opted to treat with doxy in case of early lyme disease after discussion of risks/benefits given what she thinks may have been an attach, engorged deer tick she scraped off and lost before she could identify it. Discussed possibility of influenza, diagnostic options, treatment options, risks, etc.  Follow up next week if stable or improving. Patient agrees to seek prompt in person care if worsening, new symptoms arise, or if is not improving with treatment.   I discussed the assessment and treatment plan with the patient. The patient was provided an opportunity to ask questions and all were answered. The patient agreed with the plan and demonstrated an understanding of the instructions.   The patient was advised to call back or seek an in-person evaluation if the symptoms worsen or if the condition fails to improve as anticipated.   Brittany Kern, DO   Patient Instructions   Follow up:  In 5-7 days  -I sent the medication(s) we discussed to your pharmacy:  Meds ordered this encounter  Medications  . doxycycline (VIBRA-TABS) 100 MG tablet    Sig: Take 1 tablet (100 mg total) by mouth 2 (two) times daily.     Dispense:  28 tablet    Refill:  0   Please let us know if you have any questions or concerns regarding this prescription.  I hope you are feeling better soon! Seek care promptly if your symptoms worsen, new concerns arise or you are not improving with treatment.   Self Isolation/Home Quarantine: -see the CDC site for information:   RunningShows.co.za.html   -STAY HOME except for to seek medical care -stay in your own room away from others in your house and use a separate bathroom if possible -Wash hands frequently, wear a mask if you leave your room and interact as little as possible with others -seek medical care immediately if worsening - call our office for a visit or call ahead if going elsewhere to an urgent care  -seek emergency care  if very sick or severe symptoms - call 911 -isolate for at least 10 days from the onset of symptoms PLUS 3 days of no fever PLUS 3 days of improving symptoms    Novel Coronavirus Testing:  Positive test. These tests are not 100% perfect, but if you tested positive for COVID-19, this confirms that you have contracted the SARS-CoV-2 virus. STAY HOME to complete full Quarantine per CDC guidelines.  Negative test. These tests are not 100% perfect but if you tested negative for COVID-19, this indicates that you may not have contracted the SARS-CoV-2 virus. Follow your doctor's recommendations and the CDC guidelines.

## 2019-03-14 LAB — NOVEL CORONAVIRUS, NAA: SARS-CoV-2, NAA: NOT DETECTED

## 2019-03-18 ENCOUNTER — Encounter: Payer: Self-pay | Admitting: Family Medicine

## 2019-03-18 ENCOUNTER — Other Ambulatory Visit: Payer: Self-pay

## 2019-03-18 ENCOUNTER — Telehealth (INDEPENDENT_AMBULATORY_CARE_PROVIDER_SITE_OTHER): Payer: 59 | Admitting: Family Medicine

## 2019-03-18 DIAGNOSIS — R0981 Nasal congestion: Secondary | ICD-10-CM | POA: Diagnosis not present

## 2019-03-18 DIAGNOSIS — J302 Other seasonal allergic rhinitis: Secondary | ICD-10-CM | POA: Diagnosis not present

## 2019-03-18 DIAGNOSIS — R509 Fever, unspecified: Secondary | ICD-10-CM | POA: Diagnosis not present

## 2019-03-18 DIAGNOSIS — B882 Other arthropod infestations: Secondary | ICD-10-CM

## 2019-03-18 NOTE — Progress Notes (Signed)
Virtual Visit via Video Note  I connected with Brittany Shepherd  on 03/18/19 at 10:20 AM EDT by a video enabled telemedicine application and verified that I am speaking with the correct person using two identifiers.  Location patient: home Location provider:work or home office Persons participating in the virtual visit: patient, provider  I discussed the limitations of evaluation and management by telemedicine and the availability of in person appointments. The patient expressed understanding and agreed to proceed.   HPI:  Follow up fevers and resp illness, had also had a tick bite: -reports started feeling better since yesterday, energy now much better and no fever -she still has some sinus issues - they seems to be relieved with loratidine -the COVID19 test was negative -she is on an abx for tickborne illness -denies fevers, aches and pains, malaise today   ROS: See pertinent positives and negatives per HPI.  Past Medical History:  Diagnosis Date  . Alcohol abuse   . Anxiety - [facility]  . Arthritis    shoulder  . Chicken pox   . Depression   . Eating disorder   . GERD (gastroesophageal reflux disease)   . Headache(784.0)    frequent  . Hepatitis   . History of abnormal cervical Pap smear, s/p remote LEEP, normal paps since 04/22/2012  . History of pregnancy, G2P2 04/22/2012  . Hx of adenomatous polyp of colon 08/31/2016  . Hypertension    high blood pressure readings/white coat syndrome  . Jaundice   . Kidney stones   . Nevus    L face, eval by derm in the past, told cosmetic  . Shoulder arthritis - has seen [facility] 04/22/2012  . Urine incontinence     Past Surgical History:  Procedure Laterality Date  . urethrea stretched  age 11    Family History  Problem Relation Age of Onset  . Arthritis Other        paternal great grandfather  . Arthritis Other        parent  . Alcohol abuse Other        paternal and maternal uncle  .  Prostate cancer Paternal Grandfather   . Hyperlipidemia Mother   . Heart disease Maternal Grandmother   . Hypertension Other   . Mental illness Other   . Diabetes Other   . ADD / ADHD Brother   . ADD / ADHD Son   . Colon cancer Other     SOCIAL HX: see hpi   Current Outpatient Medications:  .  acetaminophen (TYLENOL) 500 MG tablet, Take 500 mg by mouth every 6 (six) hours as needed., Disp: , Rfl:  .  CALCIUM PO, Take by mouth., Disp: , Rfl:  .  doxycycline (VIBRA-TABS) 100 MG tablet, Take 1 tablet (100 mg total) by mouth 2 (two) times daily., Disp: 28 tablet, Rfl: 0 .  Ergocalciferol (VITAMIN D2 PO), Take by mouth., Disp: , Rfl:  .  loratadine (CLARITIN) 10 MG tablet, Take 10 mg by mouth daily as needed. , Disp: , Rfl:  .  Multiple Vitamins-Minerals (ZINC PO), Take by mouth., Disp: , Rfl:  .  SELENIUM PO, Take by mouth., Disp: , Rfl:  .  TURMERIC PO, Take by mouth., Disp: , Rfl:  .  vitamin B-12 (CYANOCOBALAMIN) 1000 MCG tablet, Take 2,500 mcg by mouth daily., Disp: , Rfl:   Current Facility-Administered Medications:  .  0.9 %  sodium chloride infusion, 500 mL, Intravenous, Continuous, Carlean Purl Ofilia Neas, MD  EXAM:  VITALS per patient if applicable: Wt 0000000 lbs and T 97.8  GENERAL: alert, oriented, appears well and in no acute distress  HEENT: atraumatic, conjunttiva clear, no obvious abnormalities on inspection of external nose and ears  NECK: normal movements of the head and neck  LUNGS: on inspection no signs of respiratory distress, breathing rate appears normal, no obvious gross SOB, gasping or wheezing  CV: no obvious cyanosis  MS: moves all visible extremities without noticeable abnormality  PSYCH/NEURO: pleasant and cooperative, no obvious depression or anxiety, speech and thought processing grossly intact  ASSESSMENT AND PLAN:  Discussed the following assessment and plan:  Sinus congestion  Seasonal allergies  Febrile illness  Tick-borne disease,  possible  -seems to be doing well -complete abx -complete isolation -continue loratidine daily until winter -follow up: she prefers to see me when able, she will establish with Dr. Ethlyn Gallery and see me for acute and follow ups when a virtual visit is appropriate. TOC with Dr. Ethlyn Gallery in 4-6 months.   I discussed the assessment and treatment plan with the patient. The patient was provided an opportunity to ask questions and all were answered. The patient agreed with the plan and demonstrated an understanding of the instructions.   The patient was advised to call back or seek an in-person evaluation if the symptoms worsen or if the condition fails to improve as anticipated.   Lucretia Kern, DO

## 2019-03-22 ENCOUNTER — Ambulatory Visit (INDEPENDENT_AMBULATORY_CARE_PROVIDER_SITE_OTHER): Payer: 59

## 2019-03-22 DIAGNOSIS — Z23 Encounter for immunization: Secondary | ICD-10-CM

## 2019-04-04 ENCOUNTER — Other Ambulatory Visit: Payer: Self-pay

## 2019-04-04 ENCOUNTER — Encounter: Payer: Self-pay | Admitting: Family Medicine

## 2019-04-04 ENCOUNTER — Ambulatory Visit (INDEPENDENT_AMBULATORY_CARE_PROVIDER_SITE_OTHER): Payer: 59 | Admitting: Family Medicine

## 2019-04-04 DIAGNOSIS — E538 Deficiency of other specified B group vitamins: Secondary | ICD-10-CM | POA: Diagnosis not present

## 2019-04-04 DIAGNOSIS — R739 Hyperglycemia, unspecified: Secondary | ICD-10-CM

## 2019-04-04 DIAGNOSIS — E785 Hyperlipidemia, unspecified: Secondary | ICD-10-CM

## 2019-04-04 NOTE — Progress Notes (Signed)
Virtual Visit via Video Note  I connected with Brittany Shepherd   on 04/04/19 at  9:30 AM EDT by a video enabled telemedicine application and verified that I am speaking with the correct person using two identifiers.  Location patient: home Location provider:work office Persons participating in the virtual visit: patient, provider  I discussed the limitations of evaluation and management by telemedicine and the availability of in person appointments. The patient expressed understanding and agreed to proceed.   Brittany Shepherd DOB: [birth year] Encounter date: 04/04/2019  This is a 58 y.o. female who presents to establish care. Chief Complaint  Patient presents with  . Establish Care    History of present illness: (visit earlier this month with [facility] for f/u fevers, resp illness, and on tx for tick bite). She is through antibiotics. Went through [facility].   HTN: Not on any medications for this. 7 years ago got sober which seemed to correct a lot of issues.   Hyperlipidemia: not been on medication. Improved with dietary changes, exercises. Mother allergic to statins so she has been hesitant to start medications. Was walking a lot, but fell off a few weeks ago. Was walking 7-8 miles/day. Has house [place] in [facility] and went down there to [place] for a couple weeks and got out of rhythm. Walks dogs twice daily. [place] mountain dog/mix (120lb+). Other is pitbull/terrier 60lb. Can't walk together. Was tracking dietary intake/doing keto last year. Improved TG with this, but other cholesterol elevated. Using more olive oil, trying to make good choices, but just addicted to carbs/sweets.   GERD: no longer present. Improved after stopping alcohol.   Anxiety no longer issue - was on prozac. Did get counseling which helped her get self to quitting. [facility] Was able to pay on sliding scale. This helped her get to strong enough place to make arrangement to quit drinking.  She switched to Dr. Maudie Mercury who agreed to do her evaluation prior to going into rehab. Went to [facility]. Detoxed medically.  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 Hep C was negative checked 2 years ago. Tested + for hep b once but negative on follow up testing.   Kidney stones:has seen urology; anatomy predisposes for stone formation  Hx of abnormal pap: last normal. Due for repeat in 08/2018  Stays active with yoga, exercise  Last mammogram 08/2018: normal Last colonoscopy: 08/2016; repeat suggested in 5 years   Past Medical History:  Diagnosis Date  . Alcohol abuse   . Anxiety - [facility]/22/2012  . Arthritis    shoulder  . Chicken pox   . Depression   . Eating disorder   . GERD (gastroesophageal reflux disease)   . Headache(784.0)    frequent  . Hepatitis   . History of abnormal cervical Pap smear, s/p remote LEEP, normal paps since 04/22/2012  . History of pregnancy, G2P2 04/22/2012  . Hx of adenomatous polyp of colon 08/31/2016  . Hypertension    high blood pressure readings/white coat syndrome  . Jaundice   . Kidney stones   . Nevus    L face, eval by derm in the past, told cosmetic  . Shoulder arthritis - has seen [facility] 04/22/2012  . Urine incontinence    Past Surgical History:  Procedure Laterality Date  . urethra dilation  age 15   No Known Allergies Current Meds  Medication Sig  . acetaminophen (TYLENOL) 650 MG CR tablet Take 650 mg by  mouth every 8 (eight) hours as needed for pain.  Marland Kitchen CALCIUM PO Take 600 mg by mouth.   . Cholecalciferol (VITAMIN D3 PO) Take 2,000 Units by mouth daily.  Marland Kitchen loratadine (CLARITIN) 10 MG tablet Take 10 mg by mouth daily as needed.   . Multiple Vitamins-Minerals (ZINC PO) Take 50 mg by mouth daily.   . Omega-3 Fatty Acids (OMEGA 3 PO) Take 1,080 mg by mouth.  . SELENIUM PO Take 200 mcg by mouth.   . TURMERIC PO Take by mouth.  . vitamin B-12 (CYANOCOBALAMIN) 1000 MCG tablet Take 2,500 mcg by  mouth daily.  . vitamin C (ASCORBIC ACID) 500 MG tablet Take 500 mg by mouth daily.  . [DISCONTINUED] acetaminophen (TYLENOL) 500 MG tablet Take 500 mg by mouth every 6 (six) hours as needed.  . [DISCONTINUED] doxycycline (VIBRA-TABS) 100 MG tablet Take 1 tablet (100 mg total) by mouth 2 (two) times daily.  . [DISCONTINUED] Ergocalciferol (VITAMIN D2 PO) Take by mouth.   Current Facility-Administered Medications for the 04/04/19 encounter (Office Visit) with Caren Macadam, MD  Medication  . 0.9 %  sodium chloride infusion   Social History   Tobacco Use  . Smoking status: Never Smoker  . Smokeless tobacco: Never Used  Substance Use Topics  . Alcohol use: Not Currently    Comment: sober since Oct 2013.   Family History  Problem Relation Age of Onset  . Arthritis Other        paternal great grandfather  . Arthritis Other        parent  . Alcohol abuse Other        paternal and maternal uncle, great grandfather  . Prostate cancer Paternal Grandfather   . Hyperlipidemia Mother   . Osteoarthritis Mother   . Heart disease Maternal Grandmother   . Hypertension Other   . Mental illness Other   . Diabetes Other   . ADD / ADHD Brother   . Other Brother        has pacemaker  . ADD / ADHD Son   . Colon cancer Other   . Prostate cancer Father   . Kidney Stones Father   . Arthritis Sister   . Healthy Paternal Grandmother 59  . Other Cousin        muscle disease of mom's cousin  . Kidney Stones Brother   . Anxiety disorder Son   . Alcohol abuse Son   . Drug abuse Son        opioid addiction     Review of Systems  Constitutional: Negative for chills, fatigue and fever.  Respiratory: Negative for cough, chest tightness, shortness of breath and wheezing.   Cardiovascular: Negative for chest pain, palpitations and leg swelling.    Objective:  LMP 03/13/2012       BP Readings from Last 3 Encounters:  09/10/18 100/70  08/01/18 100/78  09/17/17 120/72   Wt Readings  from Last 3 Encounters:  09/10/18 130 lb 6.4 oz (59.1 kg)  08/01/18 126 lb 8 oz (57.4 kg)  09/17/17 119 lb (54 kg)    EXAM:  GENERAL: alert, oriented, appears well and in no acute distress  HEENT: atraumatic, conjunctiva clear, no obvious abnormalities on inspection of external nose and ears  NECK: normal movements of the head and neck  LUNGS: on inspection no signs of respiratory distress, breathing rate appears normal, no obvious gross SOB, gasping or wheezing  CV: no obvious cyanosis  MS: moves all visible extremities without noticeable  abnormality  PSYCH/NEURO: pleasant and cooperative, no obvious depression or anxiety, speech and thought processing grossly intact  SKIN: no facial or neck abnormalities  Assessment/Plan  1. Low serum vitamin B12 - CBC with Differential/Platelet; Future - Vitamin B12; Future - Folate; Future  2. Hyperglycemia; better with no alcohol - Hemoglobin A1c; Future  3. Hyperlipidemia, unspecified hyperlipidemia type Diet controlled - Comprehensive metabolic panel; Future - Lipid panel; Future - TSH; Future   Has had flu shot this year.   Return for physical exam in February.  I discussed the assessment and treatment plan with the patient. The patient was provided an opportunity to ask questions and all were answered. The patient agreed with the plan and demonstrated an understanding of the instructions.   The patient was advised to call back or seek an in-person evaluation if the symptoms worsen or if the condition fails to improve as anticipated.  I provided 28 minutes of non-face-to-face time during this encounter.   Micheline Rough, MD

## 2019-04-07 ENCOUNTER — Telehealth: Payer: Self-pay | Admitting: *Deleted

## 2019-04-07 NOTE — Telephone Encounter (Signed)
I left a detailed message at the pts cell number to call back for appts as below. 

## 2019-04-07 NOTE — Telephone Encounter (Signed)
-----   Message from Caren Macadam, MD sent at 04/04/2019 10:08 AM EDT ----- Please set up lab visit followed by physical in office in Feb

## 2019-07-24 ENCOUNTER — Ambulatory Visit: Payer: 59 | Attending: Internal Medicine

## 2019-07-24 DIAGNOSIS — Z20822 Contact with and (suspected) exposure to covid-19: Secondary | ICD-10-CM

## 2019-07-25 ENCOUNTER — Telehealth: Payer: Self-pay | Admitting: Family Medicine

## 2019-07-25 ENCOUNTER — Telehealth (INDEPENDENT_AMBULATORY_CARE_PROVIDER_SITE_OTHER): Payer: 59 | Admitting: Family Medicine

## 2019-07-25 DIAGNOSIS — Z7189 Other specified counseling: Secondary | ICD-10-CM

## 2019-07-25 DIAGNOSIS — J069 Acute upper respiratory infection, unspecified: Secondary | ICD-10-CM | POA: Diagnosis not present

## 2019-07-25 LAB — NOVEL CORONAVIRUS, NAA: SARS-CoV-2, NAA: DETECTED — AB

## 2019-07-25 MED ORDER — FLUTICASONE PROPIONATE 50 MCG/ACT NA SUSP: 1.0000 | Freq: Every day | NASAL | 0 refills | Status: DC

## 2019-07-25 NOTE — Progress Notes (Signed)
Virtual Visit via Video Note  I connected with Brittany Shepherd on 07/25/19 at  1:00 PM EST by a video enabled telemedicine application 2/2 [id number] pandemic and verified that I am speaking with the correct person using two identifiers.  Location patient: home Location provider:work or home office Persons participating in the virtual visit: patient, provider  I discussed the limitations of evaluation and management by telemedicine and the availability of in person appointments. The patient expressed understanding and agreed to proceed.   HPI: Pt is a 59 yo female with h/o adenomatous polyp of colon who is followed by Dr. Ethlyn Gallery.  Pt seen for acute concern.  Started feeling like she had a cold Monday am.  Pt notes nasal drainage, cough, congestion, HA off and on, Temp 34F, throat irritation.  Pt notes loss of taste off and on for the last few days.  Tried loratadine and Tylenol.  Unaware of sick contacts.  Results from COVID testing, done yesterday, pending.   Pt gave blood last wk, ate the snack afterwards, but later felt faint in [employer].    ROS: See pertinent positives and negatives per HPI.  Past Medical History:  Diagnosis Date  . Alcohol abuse   . Anxiety - followed by [facility] 04/22/2012  . Arthritis    shoulder  . Chicken pox   . Depression   . Eating disorder   . GERD (gastroesophageal reflux disease)   . Headache(784.0)    frequent  . Hepatitis   . History of abnormal cervical Pap smear, s/p remote LEEP, normal paps since 04/22/2012  . History of pregnancy, G2P2 04/22/2012  . Hx of adenomatous polyp of colon 08/31/2016  . Hypertension    high blood pressure readings/white coat syndrome  . Jaundice   . Kidney stones   . Nevus    L face, eval by derm in the past, told cosmetic  . Shoulder arthritis - has seen [facility] 04/22/2012  . Urine incontinence     Past Surgical History:  Procedure Laterality Date  . urethra dilation  age 63    Family  History  Problem Relation Age of Onset  . Arthritis Other        paternal great grandfather  . Arthritis Other        parent  . Alcohol abuse Other        paternal and maternal uncle, great grandfather  . Prostate cancer Paternal Grandfather   . Hyperlipidemia Mother   . Osteoarthritis Mother   . Heart disease Maternal Grandmother   . Hypertension Other   . Mental illness Other   . Diabetes Other   . ADD / ADHD Brother   . Other Brother        has pacemaker  . ADD / ADHD Son   . Colon cancer Other   . Prostate cancer Father   . Kidney Stones Father   . Arthritis Sister   . Healthy Paternal Grandmother 57  . Other Cousin        muscle disease of mom's cousin  . Kidney Stones Brother   . Anxiety disorder Son   . Alcohol abuse Son   . Drug abuse Son        opioid addiction     Current Outpatient Medications:  .  acetaminophen (TYLENOL) 650 MG CR tablet, Take 650 mg by mouth every 8 (eight) hours as needed for pain., Disp: , Rfl:  .  CALCIUM PO, Take 600 mg by mouth. , Disp: ,  Rfl:  .  Cholecalciferol (VITAMIN D3 PO), Take 2,000 Units by mouth daily., Disp: , Rfl:  .  loratadine (CLARITIN) 10 MG tablet, Take 10 mg by mouth daily as needed. , Disp: , Rfl:  .  Multiple Vitamins-Minerals (ZINC PO), Take 50 mg by mouth daily. , Disp: , Rfl:  .  Omega-3 Fatty Acids (OMEGA 3 PO), Take 1,080 mg by mouth., Disp: , Rfl:  .  SELENIUM PO, Take 200 mcg by mouth. , Disp: , Rfl:  .  TURMERIC PO, Take by mouth., Disp: , Rfl:  .  vitamin B-12 (CYANOCOBALAMIN) 1000 MCG tablet, Take 2,500 mcg by mouth daily., Disp: , Rfl:  .  vitamin C (ASCORBIC ACID) 500 MG tablet, Take 500 mg by mouth daily., Disp: , Rfl:   Current Facility-Administered Medications:  .  0.9 %  sodium chloride infusion, 500 mL, Intravenous, Continuous, Carlean Purl, Ofilia Neas, MD  EXAM:  VITALS per patient if applicable:  RR between 12-20 bpm  GENERAL: alert, oriented, appears well and in no acute distress  HEENT:  atraumatic, conjunctiva clear, no obvious abnormalities on inspection of external nose and ears  NECK: normal movements of the head and neck  LUNGS: on inspection no signs of respiratory distress, breathing rate appears normal, no obvious gross SOB, gasping or wheezing  CV: no obvious cyanosis  MS: moves all visible extremities without noticeable abnormality  PSYCH/NEURO: pleasant and cooperative, no obvious depression or anxiety, speech and thought processing grossly intact  ASSESSMENT AND PLAN:  Discussed the following assessment and plan:  Viral URI  -likely viral in nature given duration of symptoms:  Acute nasopharyngitis, COVID-19, influenza.  Advised of possible development of acute sinusitis. -COVID-19 test pending -discussed supportive care:  Tylenol, saline nasal rinse, flonase, hydration -continue self quarantine -given precautions for worsening symptoms - Plan: fluticasone (FLONASE) 50 MCG/ACT nasal spray  Educated about COVID-19 virus infection -discussed s/s of COVID -discussed current therapies -given precautions    I discussed the assessment and treatment plan with the patient. The patient was provided an opportunity to ask questions and all were answered. The patient agreed with the plan and demonstrated an understanding of the instructions.   The patient was advised to call back or seek an in-person evaluation if the symptoms worsen or if the condition fails to improve as anticipated.   Billie Ruddy, MD

## 2019-07-25 NOTE — Telephone Encounter (Signed)
I called the pt and advised her she needs to have a virtual visit for evaluation to see what treatment is needed. Virtual visit scheduled for today with Dr Volanda Napoleon at [facility].

## 2019-07-25 NOTE — Telephone Encounter (Signed)
Pt called in regards to her results on COVID. Mychart states under value detected and standard value not detected. Pt is confused on if that means positive for covid or not. Pt can be contacted at [phone]

## 2019-07-25 NOTE — Telephone Encounter (Signed)
Looks like this was addressed through [facility] since time of call.

## 2019-07-25 NOTE — Telephone Encounter (Signed)
Patient calls In this morning about wanting to discuss an issue with the nurse, she says that she is having some really bad sinus issues and would like know if she could prescribe some antibiotics.

## 2019-07-30 ENCOUNTER — Telehealth: Payer: Self-pay | Admitting: Family Medicine

## 2019-07-30 NOTE — Telephone Encounter (Signed)
Pt would like to know if she has had her pertussis vaccine? Pt's daughter in law told her she needs it in order to be around her grand child. Pt can be reached at  [phone]. Thanks

## 2019-07-31 NOTE — Telephone Encounter (Signed)
I called the pt and informed her according to the chart, she had a Tdap on 04/02/2012.

## 2019-08-06 ENCOUNTER — Ambulatory Visit: Payer: 59 | Attending: Internal Medicine

## 2019-08-06 DIAGNOSIS — Z20822 Contact with and (suspected) exposure to covid-19: Secondary | ICD-10-CM

## 2019-08-07 LAB — NOVEL CORONAVIRUS, NAA: SARS-CoV-2, NAA: NOT DETECTED

## 2019-11-11 ENCOUNTER — Other Ambulatory Visit: Payer: Self-pay

## 2019-11-12 ENCOUNTER — Other Ambulatory Visit (HOSPITAL_COMMUNITY)
Admission: RE | Admit: 2019-11-12 | Discharge: 2019-11-12 | Disposition: A | Discharge status: Home / Self Care | Payer: 59 | Source: Ambulatory Visit | Attending: Family Medicine | Admitting: Family Medicine

## 2019-11-12 ENCOUNTER — Ambulatory Visit (INDEPENDENT_AMBULATORY_CARE_PROVIDER_SITE_OTHER): Payer: 59 | Admitting: Family Medicine

## 2019-11-12 ENCOUNTER — Encounter: Payer: Self-pay | Admitting: Family Medicine

## 2019-11-12 VITALS — BP 120/62 | HR 82 | Temp 98.0°F | Ht 63.0 in | Wt 133.0 lb

## 2019-11-12 DIAGNOSIS — R3915 Urgency of urination: Secondary | ICD-10-CM | POA: Diagnosis not present

## 2019-11-12 DIAGNOSIS — Z124 Encounter for screening for malignant neoplasm of cervix: Secondary | ICD-10-CM | POA: Diagnosis present

## 2019-11-12 DIAGNOSIS — Z0000 Encounter for general adult medical examination without abnormal findings: Secondary | ICD-10-CM

## 2019-11-12 DIAGNOSIS — E785 Hyperlipidemia, unspecified: Secondary | ICD-10-CM | POA: Diagnosis not present

## 2019-11-12 DIAGNOSIS — R739 Hyperglycemia, unspecified: Secondary | ICD-10-CM

## 2019-11-12 DIAGNOSIS — E538 Deficiency of other specified B group vitamins: Secondary | ICD-10-CM

## 2019-11-12 DIAGNOSIS — Z23 Encounter for immunization: Secondary | ICD-10-CM

## 2019-11-12 LAB — CBC WITH DIFFERENTIAL/PLATELET
Basophils Absolute: 0 10*3/uL (ref 0.0–0.1)
Basophils Relative: 0.3 % (ref 0.0–3.0)
Eosinophils Absolute: 0 10*3/uL (ref 0.0–0.7)
Eosinophils Relative: 0.5 % (ref 0.0–5.0)
HCT: 42.2 % (ref 36.0–46.0)
Hemoglobin: 13.9 g/dL (ref 12.0–15.0)
Lymphocytes Relative: 40.4 % (ref 12.0–46.0)
Lymphs Abs: 1.8 10*3/uL (ref 0.7–4.0)
MCHC: 32.9 g/dL (ref 30.0–36.0)
MCV: 85.1 fl (ref 78.0–100.0)
Monocytes Absolute: 0.3 10*3/uL (ref 0.1–1.0)
Monocytes Relative: 6.5 % (ref 3.0–12.0)
Neutro Abs: 2.3 10*3/uL (ref 1.4–7.7)
Neutrophils Relative %: 52.3 % (ref 43.0–77.0)
Platelets: 226 10*3/uL (ref 150.0–400.0)
RBC: 4.96 Mil/uL (ref 3.87–5.11)
RDW: 14 % (ref 11.5–15.5)
WBC: 4.5 10*3/uL (ref 4.0–10.5)

## 2019-11-12 LAB — COMPREHENSIVE METABOLIC PANEL
ALT: 13 U/L (ref 0–35)
AST: 15 U/L (ref 0–37)
Albumin: 4.6 g/dL (ref 3.5–5.2)
Alkaline Phosphatase: 51 U/L (ref 39–117)
BUN: 11 mg/dL (ref 6–23)
CO2: 30 mEq/L (ref 19–32)
Calcium: 9.9 mg/dL (ref 8.4–10.5)
Chloride: 101 mEq/L (ref 96–112)
Creatinine, Ser: 0.66 mg/dL (ref 0.40–1.20)
GFR: 92.14 mL/min (ref >60.00)
Glucose, Bld: 90 mg/dL (ref 70–99)
Potassium: 4.2 mEq/L (ref 3.5–5.1)
Sodium: 140 mEq/L (ref 135–145)
Total Bilirubin: 0.4 mg/dL (ref 0.2–1.2)
Total Protein: 6.8 g/dL (ref 6.0–8.3)

## 2019-11-12 LAB — POCT URINALYSIS DIPSTICK
Bilirubin, UA: NEGATIVE
Blood, UA: NEGATIVE
Glucose, UA: NEGATIVE
Ketones, UA: NEGATIVE
Leukocytes, UA: NEGATIVE
Nitrite, UA: NEGATIVE
Spec Grav, UA: 1.015 (ref 1.010–1.025)
Urobilinogen, UA: 0.2 E.U./dL
pH, UA: 6 (ref 5.0–8.0)

## 2019-11-12 LAB — LIPID PANEL
Cholesterol: 293 mg/dL — ABNORMAL HIGH (ref 0–200)
HDL: 59 mg/dL (ref >39.00)
LDL Cholesterol: 205 mg/dL — ABNORMAL HIGH (ref 0–99)
NonHDL: 234.45
Total CHOL/HDL Ratio: 5
Triglycerides: 147 mg/dL (ref 0.0–149.0)
VLDL: 29.4 mg/dL (ref 0.0–40.0)

## 2019-11-12 LAB — FOLATE: Folate: 15.3 ng/mL (ref >5.9)

## 2019-11-12 LAB — VITAMIN B12: Vitamin B-12: 520 pg/mL (ref 211–911)

## 2019-11-12 LAB — HEMOGLOBIN A1C: Hgb A1c MFr Bld: 6 % (ref 4.6–6.5)

## 2019-11-12 LAB — TSH: TSH: 1.27 u[IU]/mL (ref 0.35–4.50)

## 2019-11-12 NOTE — Patient Instructions (Addendum)
Please call to schedule mammogram  Why is Exercise Important? If I told you I had a single pill that would help you decrease stress by improving anxiety, decreasing depression, help you achieve a healthy weight, give you more energy, make you more productive, help you focus, decrease your risk of dementia/heart attack/stroke/falls, improve your bone health, and more would you be interested? These are just some of the benefits that exercise brings to you. IT IS WORTH carving out some time every day to fit in exercise. It will help in every aspect of your health. Even if you have injuries that prevent you from participating in a type of exercise you used to do; there is always something that you can do to keep exercise a part of your life. If improving your health is important, make exercise your priority. It is worth the time! If you have questions about the type of exercise that is right for you, please talk with me about this!     Exercising to Stay Healthy  Exercising regularly is important. It has many health benefits, such as:  Improving your overall fitness, flexibility, and endurance.  Increasing your bone density.  Helping with weight control.  Decreasing your body fat.  Increasing your muscle strength.  Reducing stress and tension.  Improving your overall health.   In order to become healthy and stay healthy, it is recommended that you do moderate-intensity and vigorous-intensity exercise. You can tell that you are exercising at a moderate intensity if you have a higher heart rate and faster breathing, but you are still able to hold a conversation. You can tell that you are exercising at a vigorous intensity if you are breathing much harder and faster and cannot hold a conversation while exercising. How often should I exercise? Choose an activity that you enjoy and set realistic goals. Your health care provider can help you to make an activity plan that works for you. Exercise  regularly as directed by your health care provider. This may include:  Doing resistance training twice each week, such as: ? Push-ups. ? Sit-ups. ? Lifting weights. ? Using resistance bands.  Doing a given intensity of exercise for a given amount of time. Choose from these options: ? 150 minutes of moderate-intensity exercise every week. ? 75 minutes of vigorous-intensity exercise every week. ? A mix of moderate-intensity and vigorous-intensity exercise every week.   Children, pregnant women, people who are out of shape, people who are overweight, and older adults may need to consult a health care provider for individual recommendations. If you have any sort of medical condition, be sure to consult your health care provider before starting a new exercise program. What are some exercise ideas? Some moderate-intensity exercise ideas include:  Walking at a rate of 1 mile in 15 minutes.  Biking.  Hiking.  Golfing.  Dancing.   Some vigorous-intensity exercise ideas include:  Walking at a rate of at least 4.5 miles per hour.  Jogging or running at a rate of 5 miles per hour.  Biking at a rate of at least 10 miles per hour.  Lap swimming.  Roller-skating or in-line skating.  Cross-country skiing.  Vigorous competitive sports, such as football, basketball, and soccer.  Jumping rope.  Aerobic dancing.   What are some everyday activities that can help me to get exercise?  [place] work, such as: ? Pushing a Conservation officer, nature. ? Raking and bagging leaves.  Washing and waxing your car.  Pushing a stroller.  Shoveling snow.  Gardening.  Washing windows or floors. How can I be more active in my day-to-day activities?  Use the stairs instead of the elevator.  Take a walk during your lunch break.  If you drive, park your car farther away from work or school.  If you take public transportation, get off one stop early and walk the rest of the way.  Make all of your phone  calls while standing up and walking around.  Get up, stretch, and walk around every 30 minutes throughout the day. What guidelines should I follow while exercising?  Do not exercise so much that you hurt yourself, feel dizzy, or get very short of breath.  Consult your health care provider before starting a new exercise program.  Wear comfortable clothes and shoes with good support.  Drink plenty of water while you exercise to prevent dehydration or heat stroke. Body water is lost during exercise and must be replaced.  Work out until you breathe faster and your heart beats faster. This information is not intended to replace advice given to you by your health care provider. Make sure you discuss any questions you have with your health care provider.

## 2019-11-12 NOTE — Progress Notes (Signed)
Brittany Shepherd DOB: 04-Jan-1961 Encounter date: 11/12/2019  This is a 59 y.o. female who presents for complete physical   History of present illness/Additional concerns: Was having some increased eye tearing. Thinks related to mask wearing.   Recurrent nephrolithiasis: no issues with this recently.  Last mammogram:last year; she has been sent reminder for this.   Last colonoscopy: 08/2016; repeat suggested in 5 years Due for mammogram (last 08/2018) Due for pap today  *bloodwork ordered at last visit; not yet completed  Past Medical History:  Diagnosis Date  . Alcohol abuse   . Anxiety - followed by [facility] 04/22/2012  . Arthritis    shoulder  . Chicken pox   . Depression   . Eating disorder   . GERD (gastroesophageal reflux disease)   . Headache(784.0)    frequent  . Hepatitis   . History of abnormal cervical Pap smear, s/p remote LEEP, normal paps since 04/22/2012  . History of pregnancy, G2P2 04/22/2012  . Hx of adenomatous polyp of colon 08/31/2016  . Hypertension    high blood pressure readings/white coat syndrome  . Jaundice   . Kidney stones   . Nevus    L face, eval by derm in the past, told cosmetic  . Shoulder arthritis - has seen [facility] 04/22/2012  . Urine incontinence    Past Surgical History:  Procedure Laterality Date  . urethra dilation  age 57   No Known Allergies Current Meds  Medication Sig  . acetaminophen (TYLENOL) 650 MG CR tablet Take 650 mg by mouth every 8 (eight) hours as needed for pain.  Marland Kitchen CALCIUM PO Take 600 mg by mouth.   . Cholecalciferol (VITAMIN D3 PO) Take 2,000 Units by mouth daily.  Marland Kitchen ibuprofen (ADVIL) 100 MG tablet Take 100 mg by mouth as needed for fever.  . loratadine (CLARITIN) 10 MG tablet Take 10 mg by mouth daily as needed.   . Multiple Vitamins-Minerals (ZINC PO) Take 50 mg by mouth daily.   . Omega-3 Fatty Acids (OMEGA 3 PO) Take 1,080 mg by mouth.  . SELENIUM PO Take 200 mcg by mouth.   .  TURMERIC PO Take by mouth.  . vitamin B-12 (CYANOCOBALAMIN) 1000 MCG tablet Take 2,500 mcg by mouth daily.  . vitamin C (ASCORBIC ACID) 500 MG tablet Take 500 mg by mouth daily.   Current Facility-Administered Medications for the 11/12/19 encounter (Office Visit) with Caren Macadam, MD  Medication  . 0.9 %  sodium chloride infusion   Social History   Tobacco Use  . Smoking status: Never Smoker  . Smokeless tobacco: Never Used  Substance Use Topics  . Alcohol use: Not Currently    Comment: sober since Oct 2013.   Family History  Problem Relation Age of Onset  . Arthritis Other        paternal great grandfather  . Arthritis Other        parent  . Alcohol abuse Other        paternal and maternal uncle, great grandfather  . Prostate cancer Paternal Grandfather   . Hyperlipidemia Mother   . Osteoarthritis Mother   . Heart disease Maternal Grandmother   . Hypertension Other   . Mental illness Other   . Diabetes Other   . ADD / ADHD Brother   . Other Brother        has pacemaker  . ADD / ADHD Son   . Colon cancer Other   . Prostate cancer Father   .  Kidney Stones Father   . Arthritis Sister   . Healthy Paternal Grandmother 1  . Other Cousin        muscle disease of mom's cousin  . Kidney Stones Brother   . Anxiety disorder Son   . Alcohol abuse Son   . Drug abuse Son        opioid addiction     Review of Systems  Constitutional: Negative for activity change, appetite change, chills, fatigue, fever and unexpected weight change.  HENT: Negative for congestion, ear pain, hearing loss, sinus pressure, sinus pain, sore throat and trouble swallowing.   Eyes: Negative for pain and visual disturbance.  Respiratory: Negative for cough, chest tightness, shortness of breath and wheezing.   Cardiovascular: Negative for chest pain, palpitations and leg swelling.  Gastrointestinal: Negative for abdominal pain, blood in stool, constipation, diarrhea, nausea and vomiting.   Genitourinary: Negative for difficulty urinating (urgency; some incontinence) and menstrual problem.  Musculoskeletal: Negative for arthralgias and back pain.  Skin: Negative for rash.  Neurological: Negative for dizziness, weakness, numbness and headaches.  Hematological: Negative for adenopathy. Does not bruise/bleed easily.  Psychiatric/Behavioral: Negative for sleep disturbance and suicidal ideas. The patient is not nervous/anxious.     CBC:  Lab Results  Component Value Date   WBC 4.3 09/17/2017   HGB 13.6 09/17/2017   HCT 40.8 09/17/2017   MCHC 33.4 09/17/2017   RDW 13.9 09/17/2017   PLT 199.0 09/17/2017   CMP: Lab Results  Component Value Date   NA 142 09/17/2017   K 3.9 09/17/2017   CL 103 09/17/2017   CO2 29 09/17/2017   GLUCOSE 82 09/17/2017   BUN 12 09/17/2017   CREATININE 0.62 09/17/2017   CALCIUM 10.0 09/17/2017   PROT 6.7 09/17/2017   BILITOT 0.4 09/17/2017   ALKPHOS 56 09/17/2017   ALT 17 09/17/2017   AST 14 09/17/2017   LIPID: Lab Results  Component Value Date   CHOL 261 (H) 08/01/2018   TRIG 172.0 (H) 08/01/2018   HDL 60.40 08/01/2018   LDLCALC 166 (H) 08/01/2018    Objective:  BP 120/62   Pulse 82   Temp 98 F (36.7 C) (Other (Comment))   Ht 5\' 3"  (1.6 m)   Wt 133 lb (60.3 kg)   LMP 03/13/2012   SpO2 99%   BMI 23.56 kg/m   Weight: 133 lb (60.3 kg)   BP Readings from Last 3 Encounters:  11/12/19 120/62  09/10/18 100/70  08/01/18 100/78   Wt Readings from Last 3 Encounters:  11/12/19 133 lb (60.3 kg)  09/10/18 130 lb 6.4 oz (59.1 kg)  08/01/18 126 lb 8 oz (57.4 kg)    Physical Exam Exam conducted with a chaperone present.  Constitutional:      General: She is not in acute distress.    Appearance: She is well-developed.  HENT:     Head: Normocephalic and atraumatic.     Right Ear: External ear normal.     Left Ear: External ear normal.     Mouth/Throat:     Pharynx: No oropharyngeal exudate.  Eyes:      Conjunctiva/sclera: Conjunctivae normal.     Pupils: Pupils are equal, round, and reactive to light.  Neck:     Thyroid: No thyromegaly.  Cardiovascular:     Rate and Rhythm: Normal rate and regular rhythm.     Heart sounds: Normal heart sounds. No murmur. No friction rub. No gallop.   Pulmonary:     Effort: Pulmonary  effort is normal.     Breath sounds: Normal breath sounds.  Chest:     Breasts:        Right: Normal.        Left: Normal.  Abdominal:     General: Bowel sounds are normal. There is no distension.     Palpations: Abdomen is soft. There is no mass.     Tenderness: There is no abdominal tenderness. There is no guarding.     Hernia: No hernia is present.  Genitourinary:    General: Normal vulva.     Labia:        Right: No rash or tenderness.        Left: No rash or tenderness.      Urethra: No prolapse or urethral pain.     Vagina: Normal.     Cervix: Normal.     Uterus: Normal.      Adnexa: Right adnexa normal and left adnexa normal.  Musculoskeletal:        General: No tenderness or deformity. Normal range of motion.     Cervical back: Normal range of motion and neck supple.  Lymphadenopathy:     Cervical: No cervical adenopathy.     Upper Body:     Right upper body: No supraclavicular, axillary or pectoral adenopathy.     Left upper body: No supraclavicular, axillary or pectoral adenopathy.  Skin:    General: Skin is warm and dry.     Findings: No rash.     Comments: Flesh-colored lump half centimeter mole left side temple.  Small millimeter dark brown moles scattered over back.  No moles that appear concerning at this time.  Neurological:     Mental Status: She is alert and oriented to person, place, and time.     Deep Tendon Reflexes: Reflexes normal.     Reflex Scores:      Tricep reflexes are 2+ on the right side and 2+ on the left side.      Bicep reflexes are 2+ on the right side and 2+ on the left side.      Brachioradialis reflexes are 2+ on the  right side and 2+ on the left side.      Patellar reflexes are 2+ on the right side and 2+ on the left side. Psychiatric:        Speech: Speech normal.        Behavior: Behavior normal.        Thought Content: Thought content normal.     Assessment/Plan: Health Maintenance Due  Topic Date Due  . MAMMOGRAM  08/08/2019  . PAP SMEAR-Modifier  08/20/2019   Health Maintenance reviewed - she will call to schedule mammogram.  1. Preventative health care Discussed that daily exercise is an important part of health maintenance.  She is planning to get back on track with healthier eating.  2. Urinary urgency We talked through Kegel exercises in trying these with urination as well as timed urination to help decrease strain on the sphincter.  Will check urine.  If normal, I did give her option of pelvic floor rehab.  She prefers to avoid medications for urinary issues but is interested in preventing or worsening of symptoms in the future. - POC Urinalysis Dipstick  3. Need for shingles vaccine - Varicella-zoster vaccine IM  4. Hyperglycemia - Hemoglobin A1c  5. Low serum vitamin B12 - Folate - Vitamin B12 - CBC with Differential/Platelet  6. Hyperlipidemia, unspecified hyperlipidemia type - TSH -  Lipid panel - Comprehensive metabolic panel  7. Screening for cervical cancer - PAP [[facility]]   Return for pending labs.  Micheline Rough, MD

## 2019-11-13 LAB — CYTOLOGY - PAP
Comment: NEGATIVE
Diagnosis: NEGATIVE
High risk HPV: NEGATIVE

## 2020-02-26 IMAGING — MG DIGITAL SCREENING BILATERAL MAMMOGRAM WITH CAD
4 series · 4 of 4 positions shown · non-contrast
Comparison: Previous exam(s).

CLINICAL DATA: Screening.

EXAM:
DIGITAL SCREENING BILATERAL MAMMOGRAM WITH CAD

[L CC]
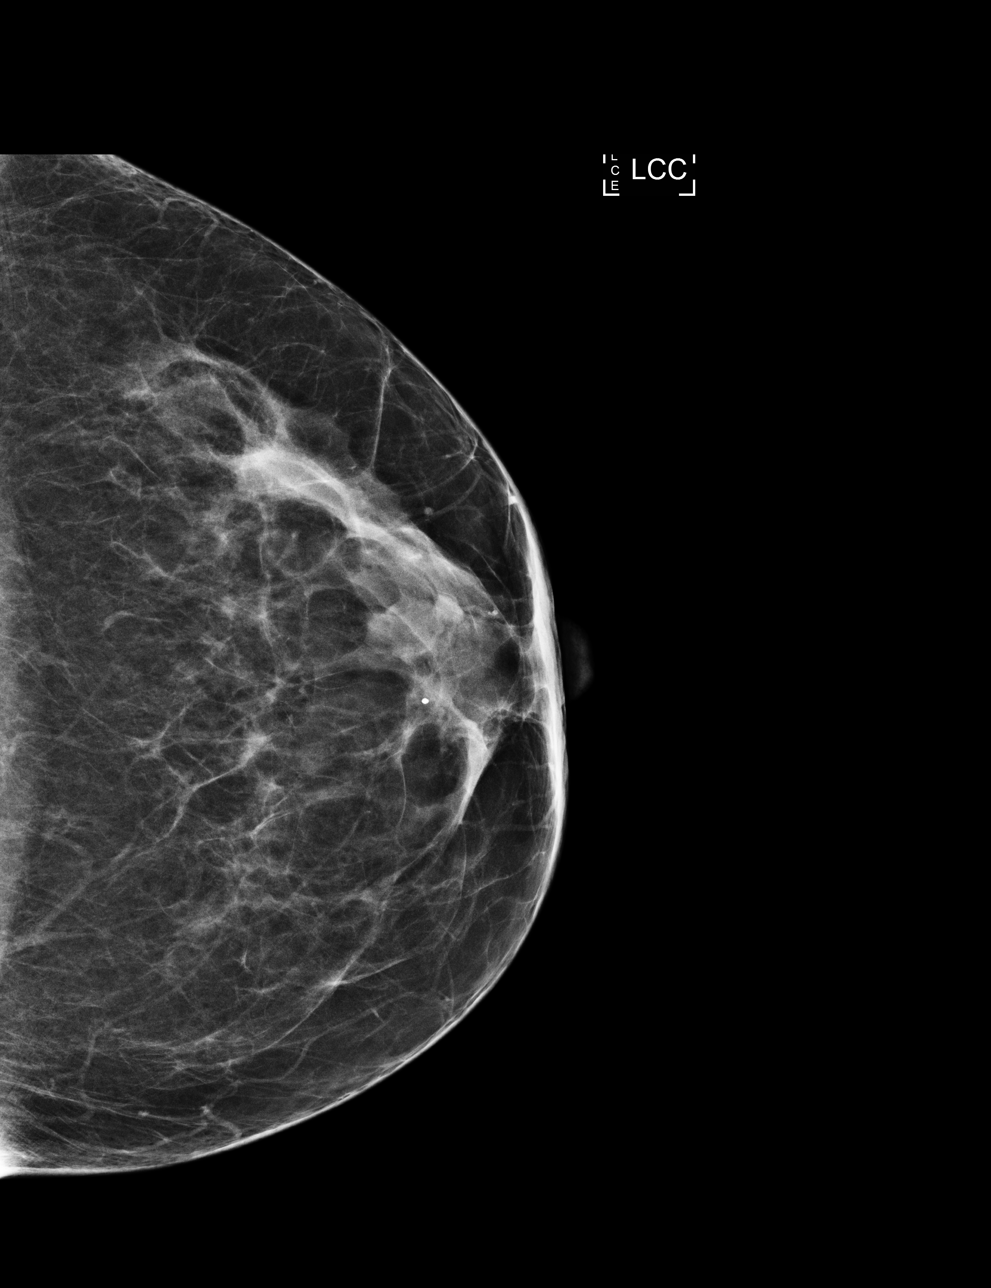

[R CC]
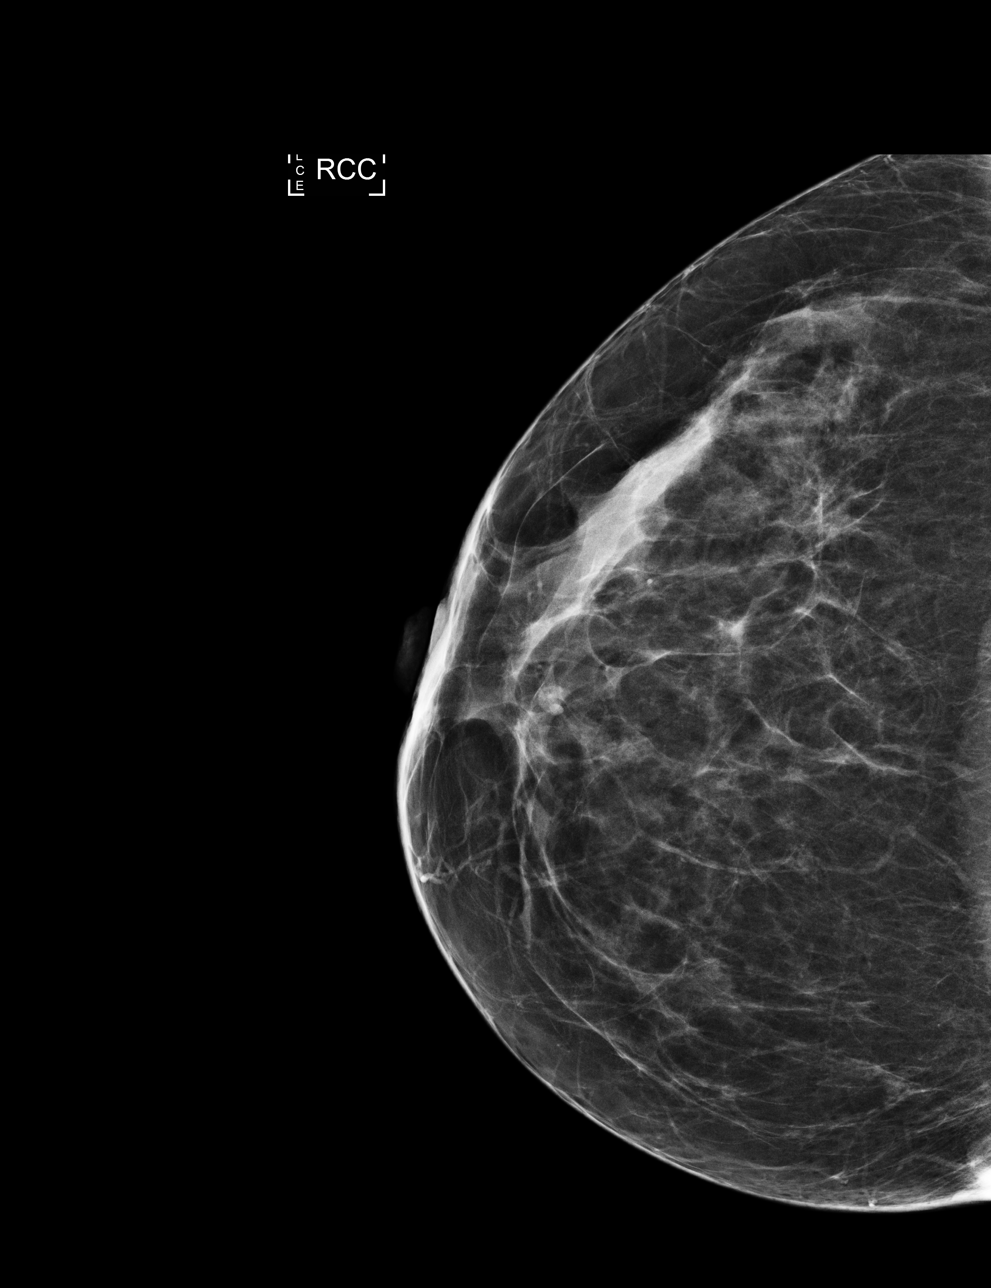

[L MLO]
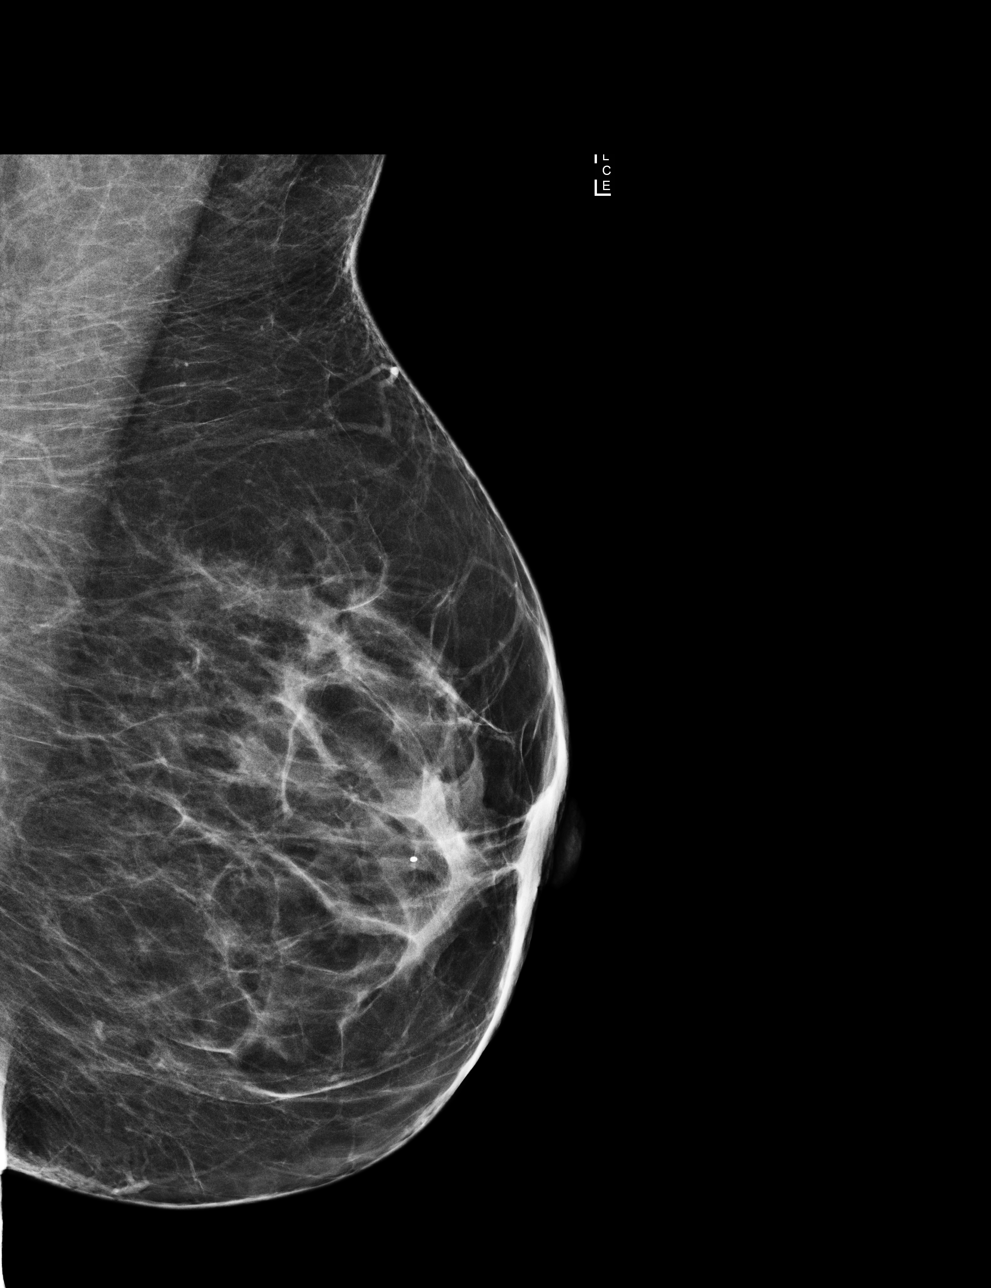

[R MLO]
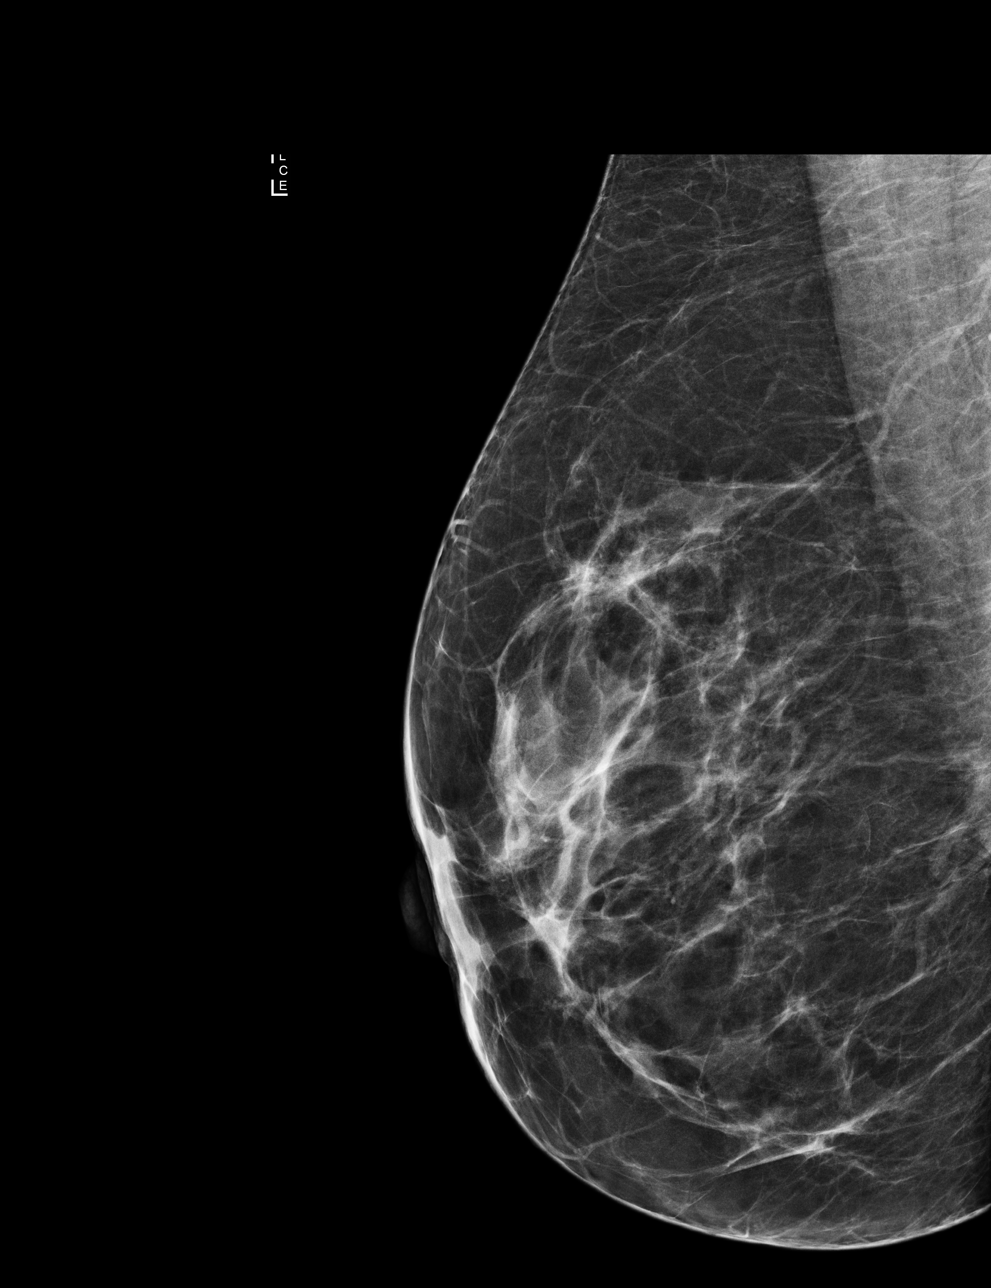

[4 of 4 positions shown; findings below may reference images not displayed]

ACR Breast Density Category c: The breast tissue is heterogeneously
dense, which may obscure small masses.
FINDINGS: In the left breast, a possible asymmetry warrants further
evaluation. In the right breast, no findings suspicious for
malignancy. Images were processed with CAD.
IMPRESSION: Further evaluation is suggested for possible asymmetry in the left
breast.

RECOMMENDATION:
Diagnostic mammogram and possibly ultrasound of the left breast.
(Code:[id number]-WW1)

The patient will be contacted regarding the findings, and additional
imaging will be scheduled.

BI-RADS CATEGORY  0: Incomplete. Need additional imaging evaluation
and/or prior mammograms for comparison.

## 2020-03-03 IMAGING — [facility] DIGITAL DIAGNOSTIC UNILATERAL LEFT MAMMOGRAM WITH TOMO AND CAD
4 series · 4 of 12 positions shown · non-contrast
Comparison: Previous exam(s).

CLINICAL DATA: 57-year-old female for further evaluation of
possible LEFT breast asymmetry on screening mammogram

EXAM:
DIGITAL DIAGNOSTIC UNILATERAL LEFT MAMMOGRAM WITH CAD AND TOMO

[L CC synth-2D]
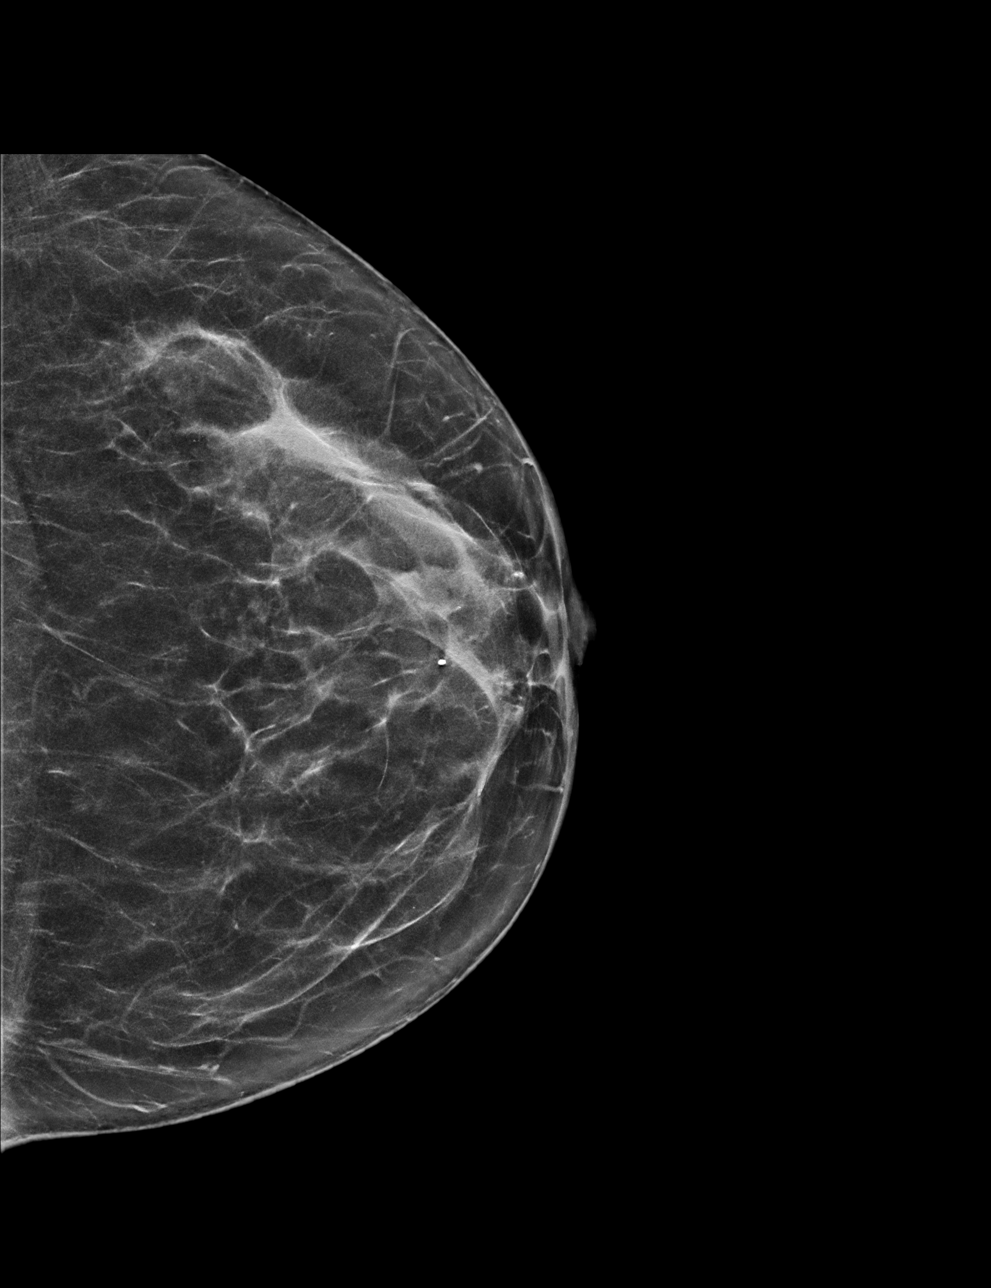

[L MLO synth-2D]
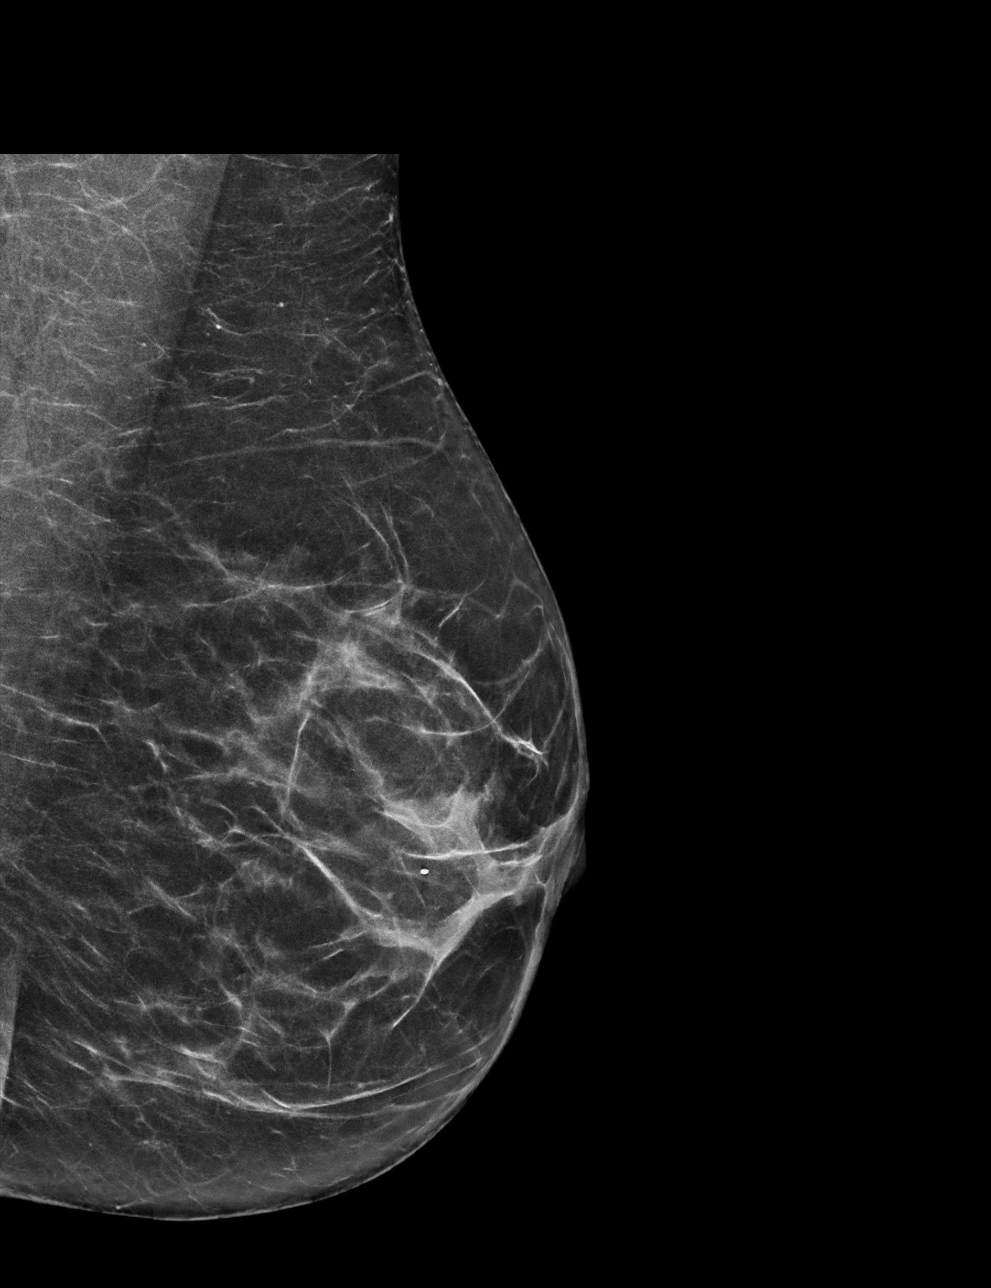

[L CC tomo · tomo slice 29/58.0]
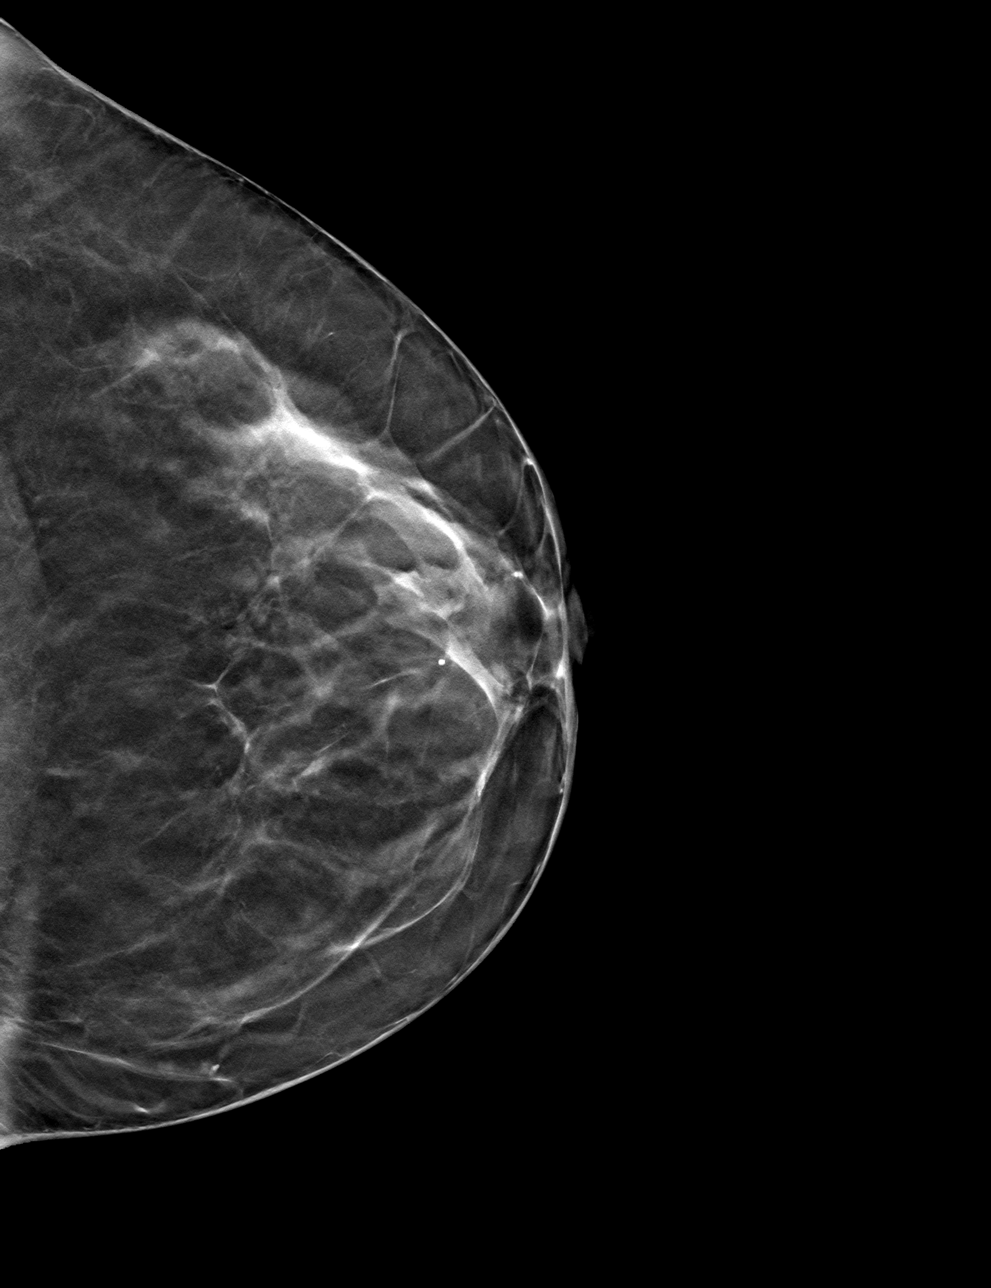

[L MLO tomo · tomo slice 30/59.0]
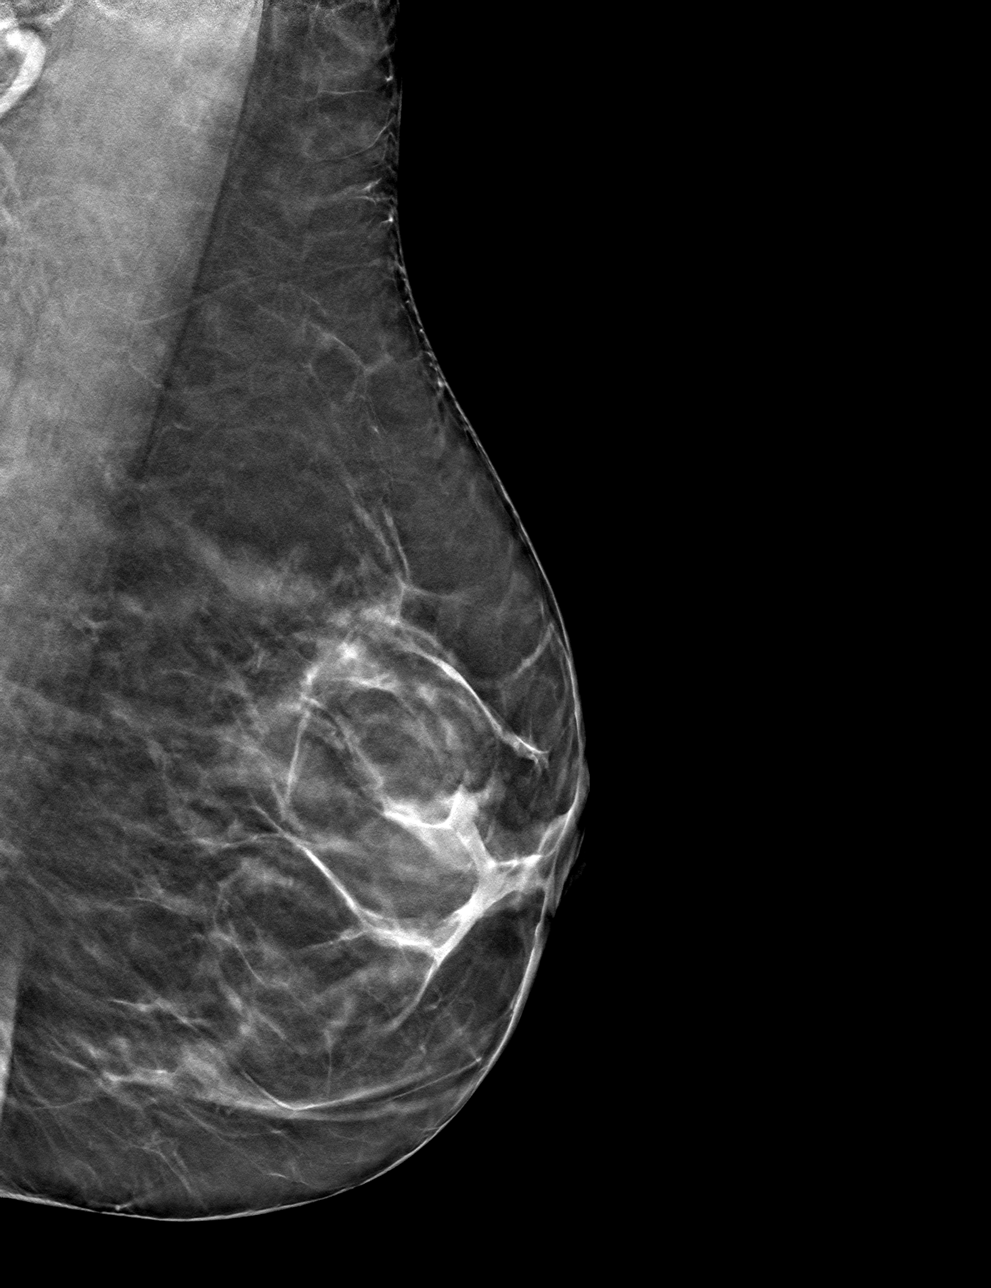

[4 of 12 positions shown; findings below may reference images not displayed]

ACR Breast Density Category b: There are scattered areas of
fibroglandular density.
FINDINGS: 2D/3D full field views of the LEFT breast demonstrate no suspicious
mass, distortion or worrisome calcifications. No persistent
abnormalities identified in the area of the screening study finding.

Mammographic images were processed with CAD.
IMPRESSION: No persistent abnormality in the area of the screening study
finding.

RECOMMENDATION:
Bilateral screening mammogram in 1 year

I have discussed the findings and recommendations with the patient.
Results were also provided in writing at the conclusion of the
visit. If applicable, a reminder letter will be sent to the patient
regarding the next appointment.

BI-RADS CATEGORY  1: Negative.

## 2020-04-19 ENCOUNTER — Ambulatory Visit (INDEPENDENT_AMBULATORY_CARE_PROVIDER_SITE_OTHER): Payer: 59 | Admitting: *Deleted

## 2020-04-19 ENCOUNTER — Other Ambulatory Visit: Payer: Self-pay

## 2020-04-19 DIAGNOSIS — Z23 Encounter for immunization: Secondary | ICD-10-CM | POA: Diagnosis not present

## 2020-05-19 ENCOUNTER — Ambulatory Visit (INDEPENDENT_AMBULATORY_CARE_PROVIDER_SITE_OTHER): Payer: 59 | Admitting: *Deleted

## 2020-05-19 ENCOUNTER — Other Ambulatory Visit: Payer: Self-pay

## 2020-05-19 DIAGNOSIS — Z23 Encounter for immunization: Secondary | ICD-10-CM

## 2021-04-29 ENCOUNTER — Other Ambulatory Visit: Payer: Self-pay

## 2021-04-29 ENCOUNTER — Encounter: Payer: Self-pay | Admitting: Family Medicine

## 2021-04-29 ENCOUNTER — Ambulatory Visit: Payer: 59

## 2021-04-29 ENCOUNTER — Ambulatory Visit (INDEPENDENT_AMBULATORY_CARE_PROVIDER_SITE_OTHER): Payer: 59 | Admitting: Family Medicine

## 2021-04-29 VITALS — BP 112/68 | HR 76 | Temp 97.7°F | Ht 62.5 in | Wt 129.8 lb

## 2021-04-29 DIAGNOSIS — Z1231 Encounter for screening mammogram for malignant neoplasm of breast: Secondary | ICD-10-CM | POA: Diagnosis not present

## 2021-04-29 DIAGNOSIS — H6981 Other specified disorders of Eustachian tube, right ear: Secondary | ICD-10-CM

## 2021-04-29 DIAGNOSIS — R739 Hyperglycemia, unspecified: Secondary | ICD-10-CM | POA: Diagnosis not present

## 2021-04-29 DIAGNOSIS — H6991 Unspecified Eustachian tube disorder, right ear: Secondary | ICD-10-CM

## 2021-04-29 DIAGNOSIS — Z0000 Encounter for general adult medical examination without abnormal findings: Secondary | ICD-10-CM

## 2021-04-29 DIAGNOSIS — N393 Stress incontinence (female) (male): Secondary | ICD-10-CM

## 2021-04-29 DIAGNOSIS — Z23 Encounter for immunization: Secondary | ICD-10-CM

## 2021-04-29 DIAGNOSIS — E785 Hyperlipidemia, unspecified: Secondary | ICD-10-CM

## 2021-04-29 LAB — COMPREHENSIVE METABOLIC PANEL
ALT: 15 U/L (ref 0–35)
AST: 13 U/L (ref 0–37)
Albumin: 5 g/dL (ref 3.5–5.2)
Alkaline Phosphatase: 58 U/L (ref 39–117)
BUN: 14 mg/dL (ref 6–23)
CO2: 28 mEq/L (ref 19–32)
Calcium: 10.2 mg/dL (ref 8.4–10.5)
Chloride: 102 mEq/L (ref 96–112)
Creatinine, Ser: 0.69 mg/dL (ref 0.40–1.20)
GFR: 95.29 mL/min (ref >60.00)
Glucose, Bld: 109 mg/dL — ABNORMAL HIGH (ref 70–99)
Potassium: 3.7 mEq/L (ref 3.5–5.1)
Sodium: 139 mEq/L (ref 135–145)
Total Bilirubin: 0.4 mg/dL (ref 0.2–1.2)
Total Protein: 7.7 g/dL (ref 6.0–8.3)

## 2021-04-29 LAB — LIPID PANEL
Cholesterol: 302 mg/dL — ABNORMAL HIGH (ref 0–200)
HDL: 69.2 mg/dL (ref >39.00)
LDL Cholesterol: 216 mg/dL — ABNORMAL HIGH (ref 0–99)
NonHDL: 232.38
Total CHOL/HDL Ratio: 4
Triglycerides: 83 mg/dL (ref 0.0–149.0)
VLDL: 16.6 mg/dL (ref 0.0–40.0)

## 2021-04-29 LAB — HEMOGLOBIN A1C: Hgb A1c MFr Bld: 5.8 % (ref 4.6–6.5)

## 2021-04-29 NOTE — Progress Notes (Signed)
Brittany Shepherd DOB: [birth year] Encounter date: 04/29/2021  This is a 60 y.o. female who presents for complete physical   History of present illness/Additional concerns:  Feeling healthy and spunky.    Something with ear. Taking loratadine or occasionally flonase if feeling congested. No real drainage, but sometimes feels like some pressure on right side face - not sore, but pressure around right maxillary sinus and right ear drum. Wonders if something going on deeper. Claritin seems to help with pressure. Just mild. Feels congested and then can hear it popping in right ear. Going on a year and a half. Historically has not had seasonal allergies. As she has aged notes some allergy symptoms.   Repeat colonoscopy 08/2021  Pap completed last year and was normal with negative HPV. No vaginal concerns today. Does sometimes wear liner to help with urgency/incontinence.  Has not been doing regular Kegel exercises.  Was doing more exercise prior to falling and hurting wrist. Has been following with ortho regular for injections. Does stay busy.   States that she has not fully given Mediterranean diet try yet.  Past Medical History:  Diagnosis Date   Alcohol abuse    Anxiety - followed by [facility] 04/22/2012   Arthritis    shoulder   Chicken pox    Depression    Eating disorder    GERD (gastroesophageal reflux disease)    Headache(784.0)    frequent   Hepatitis    History of abnormal cervical Pap smear, s/p remote LEEP, normal paps since 04/22/2012   History of pregnancy, G2P2 04/22/2012   Hx of adenomatous polyp of colon 08/31/2016   Hypertension    high blood pressure readings/white coat syndrome   Jaundice    Kidney stones    Nevus    L face, eval by derm in the past, told cosmetic   Shoulder arthritis - has seen [facility] 04/22/2012   Urine incontinence    Past Surgical History:  Procedure Laterality Date   urethra dilation  age 75   No Known  Allergies Current Meds  Medication Sig   acetaminophen (TYLENOL) 500 MG tablet Acetaminophen Extra Strength 500 mg tablet   acetaminophen (TYLENOL) 650 MG CR tablet Take 650 mg by mouth every 8 (eight) hours as needed for pain.   CALCIUM PO Take 600 mg by mouth.    Cholecalciferol (VITAMIN D3 PO) Take 2,000 Units by mouth daily.   ibuprofen (ADVIL) 100 MG tablet Take 100 mg by mouth as needed for fever.   loratadine (CLARITIN) 10 MG tablet Take 10 mg by mouth daily as needed.    methocarbamol (ROBAXIN) 500 MG tablet methocarbamol 500 mg tablet  TAKE 1 TABLET BY MOUTH EVERY 8 HOURS AS NEEDED FOR 10 DAYS   Multiple Vitamins-Minerals (ZINC PO) Take 50 mg by mouth daily.    Omega-3 Fatty Acids (OMEGA 3 PO) Take 1,080 mg by mouth.   SELENIUM PO Take 200 mcg by mouth.    TURMERIC PO Take by mouth.   vitamin B-12 (CYANOCOBALAMIN) 1000 MCG tablet Take 2,500 mcg by mouth daily.   vitamin C (ASCORBIC ACID) 500 MG tablet Take 500 mg by mouth daily.   Current Facility-Administered Medications for the 04/29/21 encounter (Office Visit) with Caren Macadam, MD  Medication   0.9 %  sodium chloride infusion   Social History   Tobacco Use   Smoking status: Never   Smokeless tobacco: Never  Substance Use Topics   Alcohol use: Not Currently  Comment: sober since Oct 2013.   Family History  Problem Relation Age of Onset   Arthritis Other        paternal great grandfather   Arthritis Other        parent   Alcohol abuse Other        paternal and maternal uncle, great grandfather   Prostate cancer Paternal Grandfather    Hyperlipidemia Mother    Osteoarthritis Mother    Heart disease Maternal Grandmother    Hypertension Other    Mental illness Other    Diabetes Other    ADD / ADHD Brother    Other Brother        has pacemaker   ADD / ADHD Son    Colon cancer Other    Prostate cancer Father    Kidney Stones Father    Arthritis Sister    Healthy Paternal Grandmother 57   Other  Cousin        muscle disease of mom's cousin   Kidney Stones Brother    Anxiety disorder Son    Alcohol abuse Son    Drug abuse Son        opioid addiction     Review of Systems  Constitutional:  Negative for activity change, appetite change, chills, fatigue, fever and unexpected weight change.  HENT:  Negative for congestion, ear pain, hearing loss, sinus pressure, sinus pain, sore throat and trouble swallowing.   Eyes:  Negative for pain and visual disturbance.  Respiratory:  Negative for cough, chest tightness, shortness of breath and wheezing.   Cardiovascular:  Negative for chest pain, palpitations and leg swelling.  Gastrointestinal:  Negative for abdominal pain, blood in stool, constipation, diarrhea, nausea and vomiting.  Genitourinary:  Negative for difficulty urinating and menstrual problem.  Musculoskeletal:  Negative for arthralgias and back pain.  Skin:  Negative for rash.  Neurological:  Negative for dizziness, weakness, numbness and headaches.  Hematological:  Negative for adenopathy. Does not bruise/bleed easily.  Psychiatric/Behavioral:  Negative for sleep disturbance and suicidal ideas. The patient is not nervous/anxious.    CBC:  Lab Results  Component Value Date   WBC 4.5 11/12/2019   HGB 13.9 11/12/2019   HCT 42.2 11/12/2019   MCHC 32.9 11/12/2019   RDW 14.0 11/12/2019   PLT 226.0 11/12/2019   CMP: Lab Results  Component Value Date   NA 139 04/29/2021   K 3.7 04/29/2021   CL 102 04/29/2021   CO2 28 04/29/2021   GLUCOSE 109 (H) 04/29/2021   BUN 14 04/29/2021   CREATININE 0.69 04/29/2021   CALCIUM 10.2 04/29/2021   PROT 7.7 04/29/2021   BILITOT 0.4 04/29/2021   ALKPHOS 58 04/29/2021   ALT 15 04/29/2021   AST 13 04/29/2021   LIPID: Lab Results  Component Value Date   CHOL 302 (H) 04/29/2021   TRIG 83.0 04/29/2021   HDL 69.20 04/29/2021   LDLCALC 216 (H) 04/29/2021    Objective:  BP 112/68 (BP Location: Left Arm, Patient Position:  Sitting, Cuff Size: Large)   Pulse 76   Temp 97.7 F (36.5 C) (Oral)   Ht 5' 2.5" (1.588 m)   Wt 129 lb 12.8 oz (58.9 kg)   LMP 03/13/2012   SpO2 98%   BMI 23.36 kg/m   Weight: 129 lb 12.8 oz (58.9 kg)   BP Readings from Last 3 Encounters:  04/29/21 112/68  11/12/19 120/62  09/10/18 100/70   Wt Readings from Last 3 Encounters:  04/29/21 129 lb  12.8 oz (58.9 kg)  11/12/19 133 lb (60.3 kg)  09/10/18 130 lb 6.4 oz (59.1 kg)    Physical Exam Constitutional:      General: She is not in acute distress.    Appearance: She is well-developed.  HENT:     Head: Normocephalic and atraumatic.     Right Ear: External ear normal.     Left Ear: External ear normal.     Mouth/Throat:     Pharynx: No oropharyngeal exudate.  Eyes:     Conjunctiva/sclera: Conjunctivae normal.     Pupils: Pupils are equal, round, and reactive to light.  Neck:     Thyroid: No thyromegaly.  Cardiovascular:     Rate and Rhythm: Normal rate and regular rhythm.     Heart sounds: Normal heart sounds. No murmur heard.   No friction rub. No gallop.  Pulmonary:     Effort: Pulmonary effort is normal.     Breath sounds: Normal breath sounds.  Abdominal:     General: Bowel sounds are normal. There is no distension.     Palpations: Abdomen is soft. There is no mass.     Tenderness: There is no abdominal tenderness. There is no guarding.     Hernia: No hernia is present.  Musculoskeletal:        General: No tenderness or deformity. Normal range of motion.     Cervical back: Normal range of motion and neck supple.  Lymphadenopathy:     Cervical: No cervical adenopathy.  Skin:    General: Skin is warm and dry.     Findings: No rash.  Neurological:     Mental Status: She is alert and oriented to person, place, and time.     Deep Tendon Reflexes: Reflexes normal.     Reflex Scores:      Tricep reflexes are 2+ on the right side and 2+ on the left side.      Bicep reflexes are 2+ on the right side and 2+ on the  left side.      Brachioradialis reflexes are 2+ on the right side and 2+ on the left side.      Patellar reflexes are 2+ on the right side and 2+ on the left side. Psychiatric:        Speech: Speech normal.        Behavior: Behavior normal.        Thought Content: Thought content normal.    Assessment/Plan: Health Maintenance Due  Topic Date Due   MAMMOGRAM  08/08/2019   Health Maintenance reviewed.  1. Preventative health care Declines covid vaccination. Encouraged regular activity. Otherwise up to date with imunizations.   2. Need for immunization against influenza - Flu Vaccine QUAD 6+ mos PF IM (Fluarix Quad PF)  3. Stress incontinence Prefers to try natural ways to improve things.  She will try Kegel exercises.  Could always consider pelvic floor therapy if not improving.  4. Encounter for screening mammogram for malignant neoplasm of breast - MM 3D SCREEN BREAST BILATERAL; Future  5. Hyperglycemia - Hemoglobin A1c; Future - Hemoglobin A1c  6. Hyperlipidemia, unspecified hyperlipidemia type  Likely some genetic component contributing to elevated LDL.  She would like to work on diet and prefers a several medication, but also wants to stay healthy and is willing to talk through options pending blood work results. - Comprehensive metabolic panel; Future - Lipid panel; Future - Lipid panel - Comprehensive metabolic panel  7. Eustachian tube dysfunction, right  Normal ear exam today.  We discussed eustachian tube dysfunction and treatment of this.  If not controlled with regular use of inhaled steroids and/or antihistamines, could certainly follow-up with ENT.   Return in about 1 year (around 04/29/2022) for physical exam.  Micheline Rough, MD

## 2021-05-04 ENCOUNTER — Ambulatory Visit
Admission: RE | Admit: 2021-05-04 | Discharge: 2021-05-04 | Disposition: A | Discharge status: Home / Self Care | Payer: 59 | Source: Ambulatory Visit | Attending: Family Medicine | Admitting: Family Medicine

## 2021-05-04 DIAGNOSIS — Z1231 Encounter for screening mammogram for malignant neoplasm of breast: Secondary | ICD-10-CM

## 2021-05-29 ENCOUNTER — Other Ambulatory Visit: Payer: Self-pay | Admitting: Family Medicine

## 2021-05-29 DIAGNOSIS — E785 Hyperlipidemia, unspecified: Secondary | ICD-10-CM

## 2021-05-29 DIAGNOSIS — R7301 Impaired fasting glucose: Secondary | ICD-10-CM

## 2021-07-01 ENCOUNTER — Other Ambulatory Visit: Payer: Self-pay | Admitting: Family Medicine

## 2021-07-01 ENCOUNTER — Telehealth: Payer: Self-pay | Admitting: Family Medicine

## 2021-07-01 DIAGNOSIS — D229 Melanocytic nevi, unspecified: Secondary | ICD-10-CM

## 2021-07-01 NOTE — Telephone Encounter (Signed)
Patient called because she would like referral to be sent to dermatology because she has a new mole that has appeared and she wants to get it checked. Patient states that it was not there before and it is a dark color.   Patient would like a callback when referral is sent so she is aware of which office it is and if patient needs to come in for an appointment first to give her a call.   Good callback number is [phone]   Please advise

## 2021-07-01 NOTE — Telephone Encounter (Signed)
I put in referral for dermatology, but just so she is aware, it can take months to get in with them. If she is worried about mole and can't see someone in next month through derm; we can always get her in here for biopsy/to check.

## 2021-07-05 NOTE — Telephone Encounter (Signed)
Patient informed of the message below and stated she has an appt with the dermatologist in February.

## 2021-12-27 ENCOUNTER — Encounter: Payer: Self-pay | Admitting: Internal Medicine

## 2022-04-11 ENCOUNTER — Encounter: Payer: 59 | Admitting: Family Medicine

## 2022-04-25 ENCOUNTER — Ambulatory Visit: Payer: 59 | Admitting: Family Medicine

## 2022-04-25 ENCOUNTER — Encounter: Payer: Self-pay | Admitting: Family Medicine

## 2022-04-25 VITALS — BP 110/80 | HR 66 | Temp 97.9°F | Ht 62.5 in | Wt 134.2 lb

## 2022-04-25 DIAGNOSIS — R5383 Other fatigue: Secondary | ICD-10-CM

## 2022-04-25 DIAGNOSIS — Z23 Encounter for immunization: Secondary | ICD-10-CM

## 2022-04-25 DIAGNOSIS — E782 Mixed hyperlipidemia: Secondary | ICD-10-CM | POA: Diagnosis not present

## 2022-04-25 DIAGNOSIS — R739 Hyperglycemia, unspecified: Secondary | ICD-10-CM

## 2022-04-25 DIAGNOSIS — Z1231 Encounter for screening mammogram for malignant neoplasm of breast: Secondary | ICD-10-CM

## 2022-04-25 DIAGNOSIS — E785 Hyperlipidemia, unspecified: Secondary | ICD-10-CM | POA: Insufficient documentation

## 2022-04-25 NOTE — Assessment & Plan Note (Signed)
LDL >200 last year, her overall 10 year CVD risk is still <5% despite her elevated LDL, I recommended continued yearly surveillance of her labs, if her risk increases >5% will consider starting medication.

## 2022-04-25 NOTE — Progress Notes (Signed)
Established Patient Office Visit  Subjective   Patient ID: Brittany Shepherd, female    DOB: 01-May-1961  Age: 61 y.o. MRN: [id number]  Chief Complaint  Patient presents with   Establish Care    Pt is here for transition of care visit.   Patient reports she has been sober from alcohol for 10 years. States that she is feeling well today. We have reviewed her medications and her current problem list. We reviewed her labs from last year, her cholesterol  was elevated last year, LDL was over 200. Pt denies any chest pain or SOB. States that she has a long family history of high cholesterol, states her mother was allergic to statins.   Patient taking calcium for bone health. States that she is feeling a bit "draggy" recently and would like to have her B12 checked today. No other associated symptoms at this time.    Current Outpatient Medications  Medication Instructions   acetaminophen (TYLENOL) 500 MG tablet Acetaminophen Extra Strength 500 mg tablet   acetaminophen (TYLENOL) 650 mg, Oral, Every 8 hours PRN   ascorbic acid (VITAMIN C) 500 mg, Oral, Daily   CALCIUM PO 600 mg, Oral   Cholecalciferol (VITAMIN D3 PO) 2,000 Units, Oral, Daily   cyanocobalamin (VITAMIN B12) 2,500 mcg, Daily   loratadine (CLARITIN) 10 mg, Oral, Daily PRN   methocarbamol (ROBAXIN) 500 MG tablet methocarbamol 500 mg tablet  TAKE 1 TABLET BY MOUTH EVERY 8 HOURS AS NEEDED FOR 10 DAYS   Multiple Vitamins-Minerals (ZINC PO) 50 mg, Oral, Daily   Omega-3 Fatty Acids (OMEGA 3 PO) 1,080 mg, Oral   SELENIUM PO 200 mcg, Oral   TURMERIC PO Oral    Patient Active Problem List   Diagnosis Date Noted   HLD (hyperlipidemia) 04/25/2022   Hx of adenomatous polyp of colon 08/31/2016      Review of Systems  All other systems reviewed and are negative.     Objective:     BP 110/80 (BP Location: Left Arm, Patient Position: Sitting, Cuff Size: Normal)   Pulse 66   Temp 97.9 F (36.6 C) (Oral)   Ht 5' 2.5"  (1.588 m)   Wt 134 lb 3.2 oz (60.9 kg)   LMP 03/13/2012   SpO2 99%   BMI 24.15 kg/m    Physical Exam Vitals reviewed.  Constitutional:      Appearance: Normal appearance. She is well-groomed and normal weight.  Eyes:     Conjunctiva/sclera: Conjunctivae normal.  Neck:     Thyroid: No thyromegaly.  Cardiovascular:     Rate and Rhythm: Normal rate and regular rhythm.     Pulses: Normal pulses.     Heart sounds: S1 normal and S2 normal.  Pulmonary:     Effort: Pulmonary effort is normal.     Breath sounds: Normal breath sounds and air entry.  Abdominal:     General: Bowel sounds are normal.  Musculoskeletal:     Right lower leg: No edema.     Left lower leg: No edema.  Neurological:     Mental Status: She is alert and oriented to person, place, and time. Mental status is at baseline.     Gait: Gait is intact.  Psychiatric:        Mood and Affect: Mood and affect normal.        Speech: Speech normal.        Behavior: Behavior normal.        Judgment: Judgment normal.  No results found for any visits on 04/25/22.    The 10-year ASCVD risk score (Arnett DK, et al., 2019) is: 2.6%    Assessment & Plan:   Problem List Items Addressed This Visit       Other   HLD (hyperlipidemia) (Chronic)    LDL >200 last year, her overall 10 year CVD risk is still <5% despite her elevated LDL, I recommended continued yearly surveillance of her labs, if her risk increases >5% will consider starting medication.       Relevant Orders   Lipid Panel   CMP   Other Visit Diagnoses   Her health maintenance was extensively reviewed, will order her mammogram and TdaP vaccine. Will also order TSH, A1C (was 5.8 at the last visit) and her B12 level.   Breast cancer screening by mammogram    -  Primary   Relevant Orders   MM Digital Screening   Hyperglycemia       Relevant Orders   Hemoglobin A1c   Other fatigue       Relevant Orders   Vitamin B12   TSH   Immunization due        Relevant Orders   Tdap vaccine greater than or equal to 7yo IM (Completed)       Return in about 1 year (around 04/26/2023) for Annual Physical Exam.    Farrel Conners, MD

## 2022-05-18 ENCOUNTER — Ambulatory Visit
Admission: RE | Admit: 2022-05-18 | Discharge: 2022-05-18 | Disposition: A | Discharge status: Home / Self Care | Payer: 59 | Source: Ambulatory Visit | Attending: Family Medicine | Admitting: Family Medicine

## 2022-05-18 DIAGNOSIS — Z1231 Encounter for screening mammogram for malignant neoplasm of breast: Secondary | ICD-10-CM

## 2022-05-22 NOTE — Progress Notes (Signed)
Normal mammo, repeat 1 year.

## 2022-05-23 ENCOUNTER — Other Ambulatory Visit (INDEPENDENT_AMBULATORY_CARE_PROVIDER_SITE_OTHER): Payer: 59

## 2022-05-23 DIAGNOSIS — R5383 Other fatigue: Secondary | ICD-10-CM

## 2022-05-23 DIAGNOSIS — E782 Mixed hyperlipidemia: Secondary | ICD-10-CM | POA: Diagnosis not present

## 2022-05-23 DIAGNOSIS — R739 Hyperglycemia, unspecified: Secondary | ICD-10-CM | POA: Diagnosis not present

## 2022-05-23 LAB — COMPREHENSIVE METABOLIC PANEL
ALT: 17 U/L (ref 0–35)
AST: 16 U/L (ref 0–37)
Albumin: 4.4 g/dL (ref 3.5–5.2)
Alkaline Phosphatase: 53 U/L (ref 39–117)
BUN: 10 mg/dL (ref 6–23)
CO2: 29 mEq/L (ref 19–32)
Calcium: 9.4 mg/dL (ref 8.4–10.5)
Chloride: 103 mEq/L (ref 96–112)
Creatinine, Ser: 0.7 mg/dL (ref 0.40–1.20)
GFR: 94.26 mL/min (ref >60.00)
Glucose, Bld: 87 mg/dL (ref 70–99)
Potassium: 4.3 mEq/L (ref 3.5–5.1)
Sodium: 137 mEq/L (ref 135–145)
Total Bilirubin: 0.4 mg/dL (ref 0.2–1.2)
Total Protein: 6.7 g/dL (ref 6.0–8.3)

## 2022-05-23 LAB — LIPID PANEL
Cholesterol: 257 mg/dL — ABNORMAL HIGH (ref 0–200)
HDL: 54.7 mg/dL (ref >39.00)
LDL Cholesterol: 172 mg/dL — ABNORMAL HIGH (ref 0–99)
NonHDL: 202.67
Total CHOL/HDL Ratio: 5
Triglycerides: 154 mg/dL — ABNORMAL HIGH (ref 0.0–149.0)
VLDL: 30.8 mg/dL (ref 0.0–40.0)

## 2022-05-23 LAB — HEMOGLOBIN A1C: Hgb A1c MFr Bld: 6 % (ref 4.6–6.5)

## 2022-05-23 LAB — TSH: TSH: 1.15 u[IU]/mL (ref 0.35–5.50)

## 2022-05-23 LAB — VITAMIN B12: Vitamin B-12: 632 pg/mL (ref 211–911)

## 2022-05-26 ENCOUNTER — Other Ambulatory Visit: Payer: 59

## 2023-05-28 ENCOUNTER — Encounter: Payer: 59 | Admitting: Family Medicine

## 2023-08-09 ENCOUNTER — Other Ambulatory Visit: Payer: Self-pay | Admitting: Internal Medicine

## 2023-08-15 ENCOUNTER — Encounter: Payer: Self-pay | Admitting: Internal Medicine
